# Patient Record
Sex: Female | Born: 1960 | Race: White | Hispanic: No | Marital: Married | Smoking: Never smoker
Health system: Southern US, Community
[De-identification: ages and names within clinical notes are randomized; demographics above are authoritative.]

## PROBLEM LIST (undated history)

## (undated) DIAGNOSIS — I499 Cardiac arrhythmia, unspecified: Secondary | ICD-10-CM

## (undated) DIAGNOSIS — I10 Essential (primary) hypertension: Secondary | ICD-10-CM

## (undated) DIAGNOSIS — E079 Disorder of thyroid, unspecified: Secondary | ICD-10-CM

## (undated) DIAGNOSIS — Z8619 Personal history of other infectious and parasitic diseases: Secondary | ICD-10-CM

## (undated) DIAGNOSIS — E039 Hypothyroidism, unspecified: Secondary | ICD-10-CM

## (undated) DIAGNOSIS — M797 Fibromyalgia: Secondary | ICD-10-CM

## (undated) DIAGNOSIS — R7301 Impaired fasting glucose: Secondary | ICD-10-CM

## (undated) DIAGNOSIS — M1990 Unspecified osteoarthritis, unspecified site: Secondary | ICD-10-CM

## (undated) DIAGNOSIS — R7303 Prediabetes: Secondary | ICD-10-CM

## (undated) HISTORY — PX: EYE SURGERY: SHX253

## (undated) HISTORY — DX: Cardiac arrhythmia, unspecified: I49.9

## (undated) HISTORY — DX: Fibromyalgia: M79.7

## (undated) HISTORY — PX: PARTIAL HYSTERECTOMY: SHX80

## (undated) HISTORY — DX: Essential (primary) hypertension: I10

## (undated) HISTORY — DX: Hypothyroidism, unspecified: E03.9

## (undated) HISTORY — PX: CATARACT EXTRACTION, BILATERAL: SHX1313

## (undated) HISTORY — DX: Personal history of other infectious and parasitic diseases: Z86.19

## (undated) HISTORY — DX: Disorder of thyroid, unspecified: E07.9

## (undated) HISTORY — PX: ABDOMINAL HYSTERECTOMY: SHX81

## (undated) HISTORY — DX: Impaired fasting glucose: R73.01

## (undated) HISTORY — DX: Unspecified osteoarthritis, unspecified site: M19.90

---

## 1993-01-17 HISTORY — PX: PARTIAL HYSTERECTOMY: SHX80

## 2009-02-01 ENCOUNTER — Inpatient Hospital Stay (HOSPITAL_COMMUNITY): Admission: EM | Admit: 2009-02-01 | Discharge: 2009-02-05 | Payer: Self-pay | Admitting: Emergency Medicine

## 2009-02-01 ENCOUNTER — Ambulatory Visit: Payer: Self-pay | Admitting: Cardiology

## 2009-07-01 ENCOUNTER — Encounter: Payer: Self-pay | Admitting: Cardiology

## 2010-04-05 LAB — POCT CARDIAC MARKERS
CKMB, poc: <1 ng/mL — ABNORMAL LOW (ref 1.0–8.0)
Myoglobin, poc: 53.2 ng/mL (ref 12–200)
Troponin i, poc: <0.05 ng/mL (ref 0.00–0.09)
Troponin i, poc: <0.05 ng/mL (ref 0.00–0.09)

## 2010-04-05 LAB — BASIC METABOLIC PANEL
BUN: 8 mg/dL (ref 6–23)
CO2: 22 mEq/L (ref 19–32)
CO2: 22 mEq/L (ref 19–32)
Calcium: 8.6 mg/dL (ref 8.4–10.5)
Chloride: 106 mEq/L (ref 96–112)
Chloride: 109 mEq/L (ref 96–112)
Creatinine, Ser: 0.59 mg/dL (ref 0.4–1.2)
Creatinine, Ser: 0.69 mg/dL (ref 0.4–1.2)
Creatinine, Ser: 0.75 mg/dL (ref 0.4–1.2)
GFR calc Af Amer: >60 mL/min (ref >60)
GFR calc Af Amer: >60 mL/min (ref >60)
GFR calc non Af Amer: >60 mL/min (ref >60)
Sodium: 136 mEq/L (ref 135–145)
Sodium: 138 mEq/L (ref 135–145)
Sodium: 140 mEq/L (ref 135–145)

## 2010-04-05 LAB — CBC
Hemoglobin: 13.3 g/dL (ref 12.0–15.0)
MCHC: 33.7 g/dL (ref 30.0–36.0)
Platelets: 292 10*3/uL (ref 150–400)
RDW: 14.6 % (ref 11.5–15.5)

## 2010-04-05 LAB — URINALYSIS, MICROSCOPIC ONLY
Glucose, UA: NEGATIVE mg/dL
Ketones, ur: NEGATIVE mg/dL
Leukocytes, UA: NEGATIVE
Nitrite: NEGATIVE
Protein, ur: NEGATIVE mg/dL
Urobilinogen, UA: 1 mg/dL (ref 0.0–1.0)

## 2010-04-05 LAB — COMPREHENSIVE METABOLIC PANEL
Alkaline Phosphatase: 85 U/L (ref 39–117)
BUN: 14 mg/dL (ref 6–23)
CO2: 21 mEq/L (ref 19–32)
Calcium: 8.9 mg/dL (ref 8.4–10.5)
Creatinine, Ser: 0.91 mg/dL (ref 0.4–1.2)
GFR calc Af Amer: >60 mL/min (ref >60)
Glucose, Bld: 173 mg/dL — ABNORMAL HIGH (ref 70–99)

## 2010-04-05 LAB — CATECHOLAMINES, FRACTIONATED, URINE, 24 HOUR
Creatinine, Urine mg/day-CATEUR: 1.65 g/(24.h) (ref 0.63–2.50)
Dopamine 24 Hr Urine: 234 mcg/24 h (ref 52–480)
Norepinephrine 24 Hr Urine: 59 mcg/24 h (ref 15–100)
Total urine volume: 2750 mL

## 2010-04-05 LAB — CK TOTAL AND CKMB (NOT AT ARMC)
Relative Index: INVALID (ref 0.0–2.5)
Total CK: 39 U/L (ref 7–177)

## 2010-04-05 LAB — BRAIN NATRIURETIC PEPTIDE: Pro B Natriuretic peptide (BNP): <30 pg/mL (ref 0.0–100.0)

## 2010-04-05 LAB — DIFFERENTIAL
Basophils Absolute: 0.1 10*3/uL (ref 0.0–0.1)
Basophils Relative: 1 % (ref 0–1)
Eosinophils Absolute: 0.1 10*3/uL (ref 0.0–0.7)
Eosinophils Relative: 1 % (ref 0–5)
Neutrophils Relative %: 50 % (ref 43–77)

## 2010-04-05 LAB — ALDOSTERONE, URINE, 24 HOUR

## 2010-04-05 LAB — TSH: TSH: 1.633 u[IU]/mL (ref 0.350–4.500)

## 2010-04-05 LAB — METANEPHRINES, URINE, 24 HOUR
Metanephrines, Ur: 92 mcg/24 h (ref 58–203)
Volume, Urine-METAN: 2750 mL

## 2010-04-05 LAB — URINE CULTURE
Colony Count: 9000
Special Requests: NEGATIVE

## 2010-04-05 LAB — T4: T4, Total: 7.5 ug/dL (ref 5.0–12.5)

## 2010-04-05 LAB — VMA + CREATININE, URINE (TIMED COLLECTION): Vanillylmandelic Acid, (VMA): 3 mg/24 h (ref <=6.0)

## 2010-04-05 LAB — CORTISOL, URINE, FREE: Volume, Urine-CORTUR: 2750 mL

## 2011-01-03 ENCOUNTER — Other Ambulatory Visit: Payer: Self-pay | Admitting: Cardiology

## 2011-01-16 ENCOUNTER — Other Ambulatory Visit: Payer: Self-pay | Admitting: Cardiology

## 2011-03-29 ENCOUNTER — Other Ambulatory Visit: Payer: Self-pay | Admitting: Cardiology

## 2011-03-31 NOTE — Telephone Encounter (Signed)
Patient not seen in over a year.

## 2011-04-22 ENCOUNTER — Encounter: Payer: Self-pay | Admitting: *Deleted

## 2011-05-23 IMAGING — CR DG CHEST 1V PORT
1 series · 1 of 1 positions shown · non-contrast
Comparison: None

CLINICAL DATA: Chest pain and shortness of breath.

PORTABLE CHEST - 1 VIEW

[view not recorded]
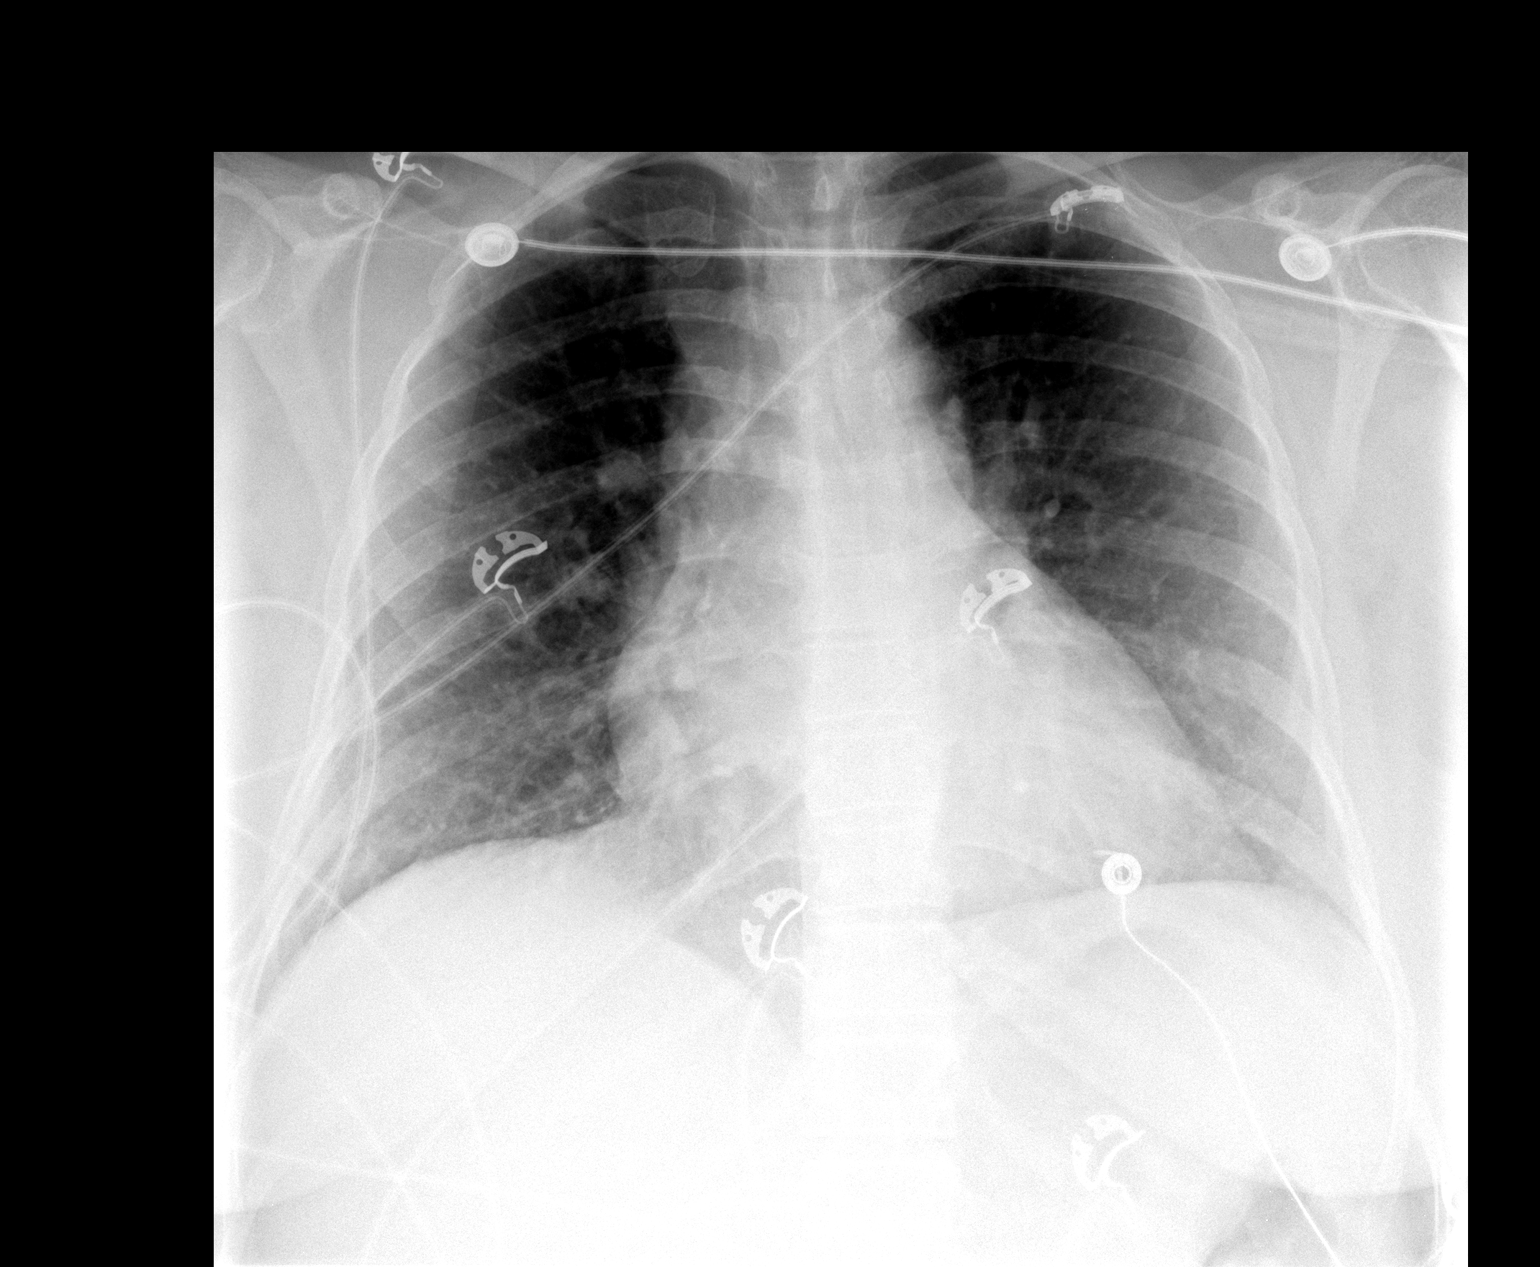

[1 of 1 positions shown; findings below may reference images not displayed]

FINDINGS: The heart is borderline enlarged.  There is mild vascular
congestion but no infiltrates, edema or effusions.  The bony thorax
is intact.
IMPRESSION: 1.  Borderline heart size.
2.  Mild vascular congestion but no infiltrates, edema or
effusions.

## 2011-05-26 ENCOUNTER — Ambulatory Visit: Payer: Self-pay | Admitting: *Deleted

## 2011-10-19 ENCOUNTER — Encounter: Payer: Self-pay | Admitting: Cardiology

## 2013-02-19 ENCOUNTER — Other Ambulatory Visit (HOSPITAL_COMMUNITY): Payer: Self-pay | Admitting: Obstetrics and Gynecology

## 2013-02-19 DIAGNOSIS — M7989 Other specified soft tissue disorders: Secondary | ICD-10-CM

## 2013-02-19 DIAGNOSIS — M25569 Pain in unspecified knee: Secondary | ICD-10-CM

## 2013-02-20 ENCOUNTER — Ambulatory Visit (HOSPITAL_COMMUNITY)
Admission: RE | Admit: 2013-02-20 | Discharge: 2013-02-20 | Disposition: A | Discharge status: Home / Self Care | Payer: BC Managed Care – PPO | Source: Ambulatory Visit | Attending: Obstetrics and Gynecology | Admitting: Obstetrics and Gynecology

## 2013-02-20 DIAGNOSIS — M79609 Pain in unspecified limb: Secondary | ICD-10-CM

## 2013-02-20 DIAGNOSIS — M7989 Other specified soft tissue disorders: Secondary | ICD-10-CM | POA: Insufficient documentation

## 2013-02-20 DIAGNOSIS — M25569 Pain in unspecified knee: Secondary | ICD-10-CM

## 2013-02-20 NOTE — Progress Notes (Signed)
VASCULAR LAB PRELIMINARY  PRELIMINARY  PRELIMINARY  PRELIMINARY  Right lower extremity venous duplex completed.    Preliminary report:  Right:  No evidence of DVT, superficial thrombosis, or Baker's cyst.  Dreshon Proffit, RVS 02/20/2013, 3:30 PM

## 2015-03-23 DIAGNOSIS — F32A Depression, unspecified: Secondary | ICD-10-CM | POA: Insufficient documentation

## 2015-03-23 DIAGNOSIS — F329 Major depressive disorder, single episode, unspecified: Secondary | ICD-10-CM | POA: Insufficient documentation

## 2015-05-21 DIAGNOSIS — D649 Anemia, unspecified: Secondary | ICD-10-CM | POA: Insufficient documentation

## 2015-05-21 DIAGNOSIS — G4700 Insomnia, unspecified: Secondary | ICD-10-CM | POA: Insufficient documentation

## 2015-05-25 DIAGNOSIS — R7303 Prediabetes: Secondary | ICD-10-CM | POA: Insufficient documentation

## 2015-12-09 DIAGNOSIS — E6609 Other obesity due to excess calories: Secondary | ICD-10-CM | POA: Insufficient documentation

## 2020-02-24 ENCOUNTER — Other Ambulatory Visit: Payer: Self-pay

## 2020-02-24 ENCOUNTER — Encounter: Payer: Self-pay | Admitting: Family Medicine

## 2020-02-24 ENCOUNTER — Ambulatory Visit (INDEPENDENT_AMBULATORY_CARE_PROVIDER_SITE_OTHER): Payer: BC Managed Care – PPO | Admitting: Family Medicine

## 2020-02-24 VITALS — BP 126/68 | HR 76 | Temp 97.8°F | Ht 66.0 in | Wt 200.6 lb

## 2020-02-24 DIAGNOSIS — E039 Hypothyroidism, unspecified: Secondary | ICD-10-CM | POA: Insufficient documentation

## 2020-02-24 DIAGNOSIS — G2581 Restless legs syndrome: Secondary | ICD-10-CM | POA: Diagnosis not present

## 2020-02-24 DIAGNOSIS — M797 Fibromyalgia: Secondary | ICD-10-CM | POA: Diagnosis not present

## 2020-02-24 DIAGNOSIS — E1169 Type 2 diabetes mellitus with other specified complication: Secondary | ICD-10-CM | POA: Insufficient documentation

## 2020-02-24 DIAGNOSIS — M79651 Pain in right thigh: Secondary | ICD-10-CM | POA: Diagnosis not present

## 2020-02-24 DIAGNOSIS — R7303 Prediabetes: Secondary | ICD-10-CM | POA: Diagnosis not present

## 2020-02-24 DIAGNOSIS — M1990 Unspecified osteoarthritis, unspecified site: Secondary | ICD-10-CM

## 2020-02-24 DIAGNOSIS — I10 Essential (primary) hypertension: Secondary | ICD-10-CM

## 2020-02-24 DIAGNOSIS — E118 Type 2 diabetes mellitus with unspecified complications: Secondary | ICD-10-CM | POA: Insufficient documentation

## 2020-02-24 LAB — CBC WITH DIFFERENTIAL/PLATELET
Basophils Absolute: 0 10*3/uL (ref 0.0–0.1)
Basophils Relative: 0.6 % (ref 0.0–3.0)
Eosinophils Absolute: 0.1 10*3/uL (ref 0.0–0.7)
Eosinophils Relative: 1.6 % (ref 0.0–5.0)
HCT: 37.8 % (ref 36.0–46.0)
Hemoglobin: 12.3 g/dL (ref 12.0–15.0)
Lymphocytes Relative: 38.3 % (ref 12.0–46.0)
Lymphs Abs: 2.3 10*3/uL (ref 0.7–4.0)
MCHC: 32.5 g/dL (ref 30.0–36.0)
MCV: 84.8 fl (ref 78.0–100.0)
Monocytes Absolute: 0.5 10*3/uL (ref 0.1–1.0)
Monocytes Relative: 8.3 % (ref 3.0–12.0)
Neutro Abs: 3.1 10*3/uL (ref 1.4–7.7)
Neutrophils Relative %: 51.2 % (ref 43.0–77.0)
Platelets: 257 10*3/uL (ref 150.0–400.0)
RBC: 4.46 Mil/uL (ref 3.87–5.11)
RDW: 14.1 % (ref 11.5–15.5)
WBC: 6.1 10*3/uL (ref 4.0–10.5)

## 2020-02-24 LAB — COMPREHENSIVE METABOLIC PANEL
ALT: 26 U/L (ref 0–35)
AST: 22 U/L (ref 0–37)
Albumin: 3.8 g/dL (ref 3.5–5.2)
Alkaline Phosphatase: 85 U/L (ref 39–117)
BUN: 13 mg/dL (ref 6–23)
CO2: 27 mEq/L (ref 19–32)
Calcium: 9.5 mg/dL (ref 8.4–10.5)
Chloride: 105 mEq/L (ref 96–112)
Creatinine, Ser: 0.53 mg/dL (ref 0.40–1.20)
GFR: 100.59 mL/min (ref >60.00)
Glucose, Bld: 122 mg/dL — ABNORMAL HIGH (ref 70–99)
Potassium: 4.5 mEq/L (ref 3.5–5.1)
Sodium: 139 mEq/L (ref 135–145)
Total Bilirubin: 0.3 mg/dL (ref 0.2–1.2)
Total Protein: 6.9 g/dL (ref 6.0–8.3)

## 2020-02-24 LAB — IBC PANEL
Iron: 67 ug/dL (ref 42–145)
Saturation Ratios: 14.5 % — ABNORMAL LOW (ref 20.0–50.0)
Transferrin: 330 mg/dL (ref 212.0–360.0)

## 2020-02-24 LAB — LIPID PANEL
Cholesterol: 141 mg/dL (ref 0–200)
HDL: 38.8 mg/dL — ABNORMAL LOW (ref >39.00)
LDL Cholesterol: 85 mg/dL (ref 0–99)
NonHDL: 102.48
Total CHOL/HDL Ratio: 4
Triglycerides: 89 mg/dL (ref 0.0–149.0)
VLDL: 17.8 mg/dL (ref 0.0–40.0)

## 2020-02-24 LAB — FERRITIN: Ferritin: 26 ng/mL (ref 10.0–291.0)

## 2020-02-24 LAB — HEMOGLOBIN A1C: Hgb A1c MFr Bld: 6.4 % (ref 4.6–6.5)

## 2020-02-24 LAB — TSH: TSH: <0.01 u[IU]/mL — ABNORMAL LOW (ref 0.35–4.50)

## 2020-02-24 MED ORDER — METOPROLOL TARTRATE 50 MG PO TABS: 50.0000 mg | ORAL_TABLET | Freq: Two times a day (BID) | ORAL | 1 refills | Status: DC

## 2020-02-24 MED ORDER — CITALOPRAM HYDROBROMIDE 20 MG PO TABS: 20.0000 mg | ORAL_TABLET | Freq: Every day | ORAL | 1 refills | Status: DC

## 2020-02-24 MED ORDER — CELECOXIB 200 MG PO CAPS: 200.0000 mg | ORAL_CAPSULE | Freq: Every day | ORAL | 1 refills | Status: DC

## 2020-02-24 MED ORDER — LEVOTHYROXINE SODIUM 175 MCG PO TABS
175.0000 ug | ORAL_TABLET | Freq: Every day | ORAL | 1 refills | Status: DC
Start: 2020-02-24 — End: 2020-02-25

## 2020-02-24 MED ORDER — METFORMIN HCL 1000 MG PO TABS: 1000.0000 mg | ORAL_TABLET | Freq: Every day | ORAL | 1 refills | Status: DC

## 2020-02-24 NOTE — Assessment & Plan Note (Signed)
With impaired fasting glucose, on metformin 1000mg  daily. Update labs today.

## 2020-02-24 NOTE — Assessment & Plan Note (Signed)
Chronic, stable. Continue metoprolol 50mg  bid.

## 2020-02-24 NOTE — Assessment & Plan Note (Addendum)
On celebrex 200mg  daily for this.  Symptoms not consistent with gout.

## 2020-02-24 NOTE — Assessment & Plan Note (Signed)
Describes RLS - check iron and TSH.  Consider treatment pending results.

## 2020-02-24 NOTE — Assessment & Plan Note (Signed)
Anticipate related to CVI. Discussed with patient. Pulses ok, no neuropathy symptoms. Suggested trial thigh high compression stockings, Rx provided.  She does have enlarged R leg compared to left - will check venous US.  Consider venous surgery eval if ongoing/worsening.

## 2020-02-24 NOTE — Progress Notes (Signed)
Patient ID: Carrie Pham, female    DOB: 27-May-1960, 60 y.o.   MRN: [id number]  This visit was conducted in person.  BP 126/68 (BP Location: Left Arm, Patient Position: Sitting, Cuff Size: Large)   Pulse 76   Temp 97.8 F (36.6 C) (Temporal)   Ht 5\' 6"  (1.676 m)   Wt 200 lb 9 oz (91 kg)   SpO2 96%   BMI 32.37 kg/m    CC: new pt to establish care  Subjective:   HPI: Carrie Pham is a 60 y.o. female presenting on 02/24/2020 for New Patient (Initial Visit)   Previously saw Dr 04/23/2020 and Andrey Campanile at [facility] in [place], last seen 2017. Known h/o prediabetes, HTN, hypothyroidism, depression, arthritis and fibromyalgia.   R leg swelling and ache present for 6 months. Starts upper inner going and travels down medially below the knee. No known circulation issues. More bothersome when swollen. No aggravating factors. No warmth. R>L spider veins. Remotely tried compression stockings.   HTN - Compliant with current antihypertensive regimen of metoprolol 50mg  bid.  Does check blood pressures at home: 130s/70s. No low blood pressure readings or symptoms of dizziness/syncope. Denies HA, vision changes, CP/tightness, SOB, leg swelling.   Impaired fasting sugar - unsure when last checked A1c. Continues metformin 1000mg  daily. Does not check sugars at home. No low sugar symptoms. No high sugar symptoms of paresthesias. Last eye exam - just had cataract surgery.   Notes restless leg symptoms - worse at night.   Hypothyroid - on levothyroxine 2018 daily.   Fibromyalgia - diagnosed around 2010. On celexa 20mg  and celebrex 200mg  daily.   OBGYN - Dr  Some hot flashes post menopause.   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  Interim medical history since our last visit reviewed. Allergies and medications reviewed and updated. Outpatient Medications Prior to Visit  Medication Sig Dispense Refill  . fluconazole (DIFLUCAN) 100 MG tablet Take 100 mg by mouth daily. As needed    . celecoxib (CELEBREX) 200 MG capsule Take 200 mg by mouth 2 (two) times daily.    . citalopram (CELEXA) 20 MG tablet Take 20 mg by mouth daily.    levothyroxine (SYNTHROID) 175 MCG tablet Take 175 mcg by mouth daily before breakfast.    . metFORMIN (GLUCOPHAGE) 1000 MG tablet Take 1,000 mg by mouth 2 (two) times daily with a meal.    . metoprolol (LOPRESSOR) 50 MG tablet Take 50 mg by mouth 2 (two) times daily.    . hydrochlorothiazide (MICROZIDE) 12.5 MG capsule TAKE 1 CAPSULE DAILY 15 capsule 0  . levothyroxine (SYNTHROID, LEVOTHROID) 112 MCG tablet Take 112 mcg by mouth daily.    . metFORMIN (GLUCOPHAGE) 1000 MG tablet Take 1 tablet (1,000 mg total) by mouth daily with breakfast.    . Milnacipran (SAVELLA) 50 MG TABS Take 50 mg by mouth 2 (two) times daily.    zolpidem (AMBIEN) 10 MG tablet Take 10 mg by mouth at bedtime as needed.     No facility-administered medications prior to visit.     Per HPI unless specifically indicated in ROS section below Review of Systems Objective:  BP 126/68 (BP Location: Left Arm, Patient Position: Sitting, Cuff Size:  Large)   Pulse 76   Temp 97.8 F (36.6 C) (Temporal)   Ht 5\' 6"  (1.676 m)   Wt 200 lb 9 oz (91 kg)   SpO2 96%   BMI 32.37 kg/m   Wt Readings from Last 3 Encounters:  02/24/20 200 lb 9 oz (91 kg)      Physical Exam Vitals and nursing note reviewed.  Constitutional:      Appearance: Normal appearance. She is not ill-appearing.  Neck:     Thyroid: No thyroid mass or thyromegaly.  Cardiovascular:     Rate and Rhythm: Normal rate and regular rhythm.     Pulses: Normal pulses.     Heart sounds: Normal heart sounds. No murmur heard.   Pulmonary:     Effort: Pulmonary effort  is normal. No respiratory distress.     Breath sounds: Normal breath sounds. No wheezing, rhonchi or rales.  Musculoskeletal:     Right lower leg: No edema.     Left lower leg: No edema.     Comments:  37cm L calf circ 38cm R calf circ Crepitus with knee ROM bilaterally No pain with int/ext rotation at hip Mild discomfort to palpation medial thigh on right  Skin:    General: Skin is warm and dry.     Findings: No rash.     Comments: Spider veins medial R leg   Neurological:     Mental Status: She is alert.  Psychiatric:        Mood and Affect: Mood normal.        Behavior: Behavior normal.        Assessment & Plan:  This visit occurred during the SARS-CoV-2 public health emergency.  Safety protocols were in place, including screening questions prior to the visit, additional usage of staff PPE, and extensive cleaning of exam room while observing appropriate contact time as indicated for disinfecting solutions.   Problem List Items Addressed This Visit    Right thigh pain - Primary    Anticipate related to CVI. Discussed with patient. Pulses ok, no neuropathy symptoms. Suggested trial thigh high compression stockings, Rx provided.  She does have enlarged R leg compared to left - will check venous 04/23/20.  Consider venous surgery eval if ongoing/worsening.       Relevant Orders   US Venous Img Lower Unilateral Right (DVT)   Restless leg syndrome    Describes RLS - check iron and TSH.  Consider treatment pending results.       Relevant Orders   TSH   CBC with Differential/Platelet   Ferritin   IBC panel   Prediabetes    With impaired fasting glucose, on metformin 1000mg  daily. Update labs today.       Relevant Orders   Lipid panel   Comprehensive metabolic panel   TSH   Hemoglobin A1c   Hypothyroidism    Chronic, stable on levothyroxine - update TSH.       Relevant Medications   levothyroxine (SYNTHROID) 175 MCG tablet   metoprolol tartrate (LOPRESSOR) 50 MG  tablet   Hypertension    Chronic, stable. Continue metoprolol 50mg  bid.       Relevant Medications   metoprolol tartrate (LOPRESSOR) 50 MG tablet   Fibromyalgia    On celebrex and celexa - continue.  Previously on Savella.       Relevant Medications   celecoxib (CELEBREX) 200 MG capsule   citalopram (CELEXA) 20 MG tablet   Arthritis    On celebrex  200mg  daily for this.  Symptoms not consistent with gout.      Relevant Medications   celecoxib (CELEBREX) 200 MG capsule       Meds ordered this encounter  Medications  . celecoxib (CELEBREX) 200 MG capsule    Sig: Take 1 capsule (200 mg total) by mouth daily.    Dispense:  90 capsule    Refill:  1  . citalopram (CELEXA) 20 MG tablet    Sig: Take 1 tablet (20 mg total) by mouth daily.    Dispense:  90 tablet    Refill:  1  . levothyroxine (SYNTHROID) 175 MCG tablet    Sig: Take 1 tablet (175 mcg total) by mouth daily before breakfast.    Dispense:  90 tablet    Refill:  1  . metFORMIN (GLUCOPHAGE) 1000 MG tablet    Sig: Take 1 tablet (1,000 mg total) by mouth daily with breakfast.    Dispense:  90 tablet    Refill:  1  . metoprolol tartrate (LOPRESSOR) 50 MG tablet    Sig: Take 1 tablet (50 mg total) by mouth 2 (two) times daily.    Dispense:  180 tablet    Refill:  1   Orders Placed This Encounter  Procedures  . Venous Img Lower Unilateral Right (DVT)    Standing Status:   Future    Standing Expiration Date:   02/23/2021    Order Specific Question:   Reason for Exam (SYMPTOM  OR DIAGNOSIS REQUIRED)    Answer:   R medial thigh swelling and pain for months    Order Specific Question:   Preferred imaging location?    Answer:   04/23/2021  . Lipid panel  . Comprehensive metabolic panel  . TSH  . CBC with Differential/Platelet  . Ferritin  . IBC panel  . Hemoglobin A1c    Patient Instructions  Nice to meet you today Labs today. We will be in touch with results. Consider thigh high compression  stockings. First check leg ultrasound on the right. If ongoing trouble, we could refer you to vein doctor for further evaluation of right leg pain.  Return in 6 months for physical, sooner if needed.    Follow up plan: Return in about 6 months (around 08/23/2020), or if symptoms worsen or fail to improve, for annual exam, prior fasting for blood work.  10/23/2020, MD

## 2020-02-24 NOTE — Assessment & Plan Note (Signed)
Chronic, stable on levothyroxine - update TSH.

## 2020-02-24 NOTE — Assessment & Plan Note (Signed)
On celebrex and celexa - continue.  Previously on Savella.

## 2020-02-24 NOTE — Patient Instructions (Addendum)
Nice to meet you today Labs today. We will be in touch with results. Consider thigh high compression stockings. First check leg ultrasound on the right. If ongoing trouble, we could refer you to vein doctor for further evaluation of right leg pain.  Return in 6 months for physical, sooner if needed.

## 2020-02-25 ENCOUNTER — Encounter: Payer: Self-pay | Admitting: Family Medicine

## 2020-02-25 ENCOUNTER — Other Ambulatory Visit: Payer: Self-pay | Admitting: Family Medicine

## 2020-02-25 MED ORDER — LEVOTHYROXINE SODIUM 150 MCG PO TABS: 150.0000 ug | ORAL_TABLET | Freq: Every day | ORAL | 1 refills | Status: DC

## 2020-03-09 ENCOUNTER — Other Ambulatory Visit: Payer: Self-pay

## 2020-03-09 ENCOUNTER — Ambulatory Visit
Admission: RE | Admit: 2020-03-09 | Discharge: 2020-03-09 | Disposition: A | Discharge status: Home / Self Care | Payer: BC Managed Care – PPO | Source: Ambulatory Visit | Attending: Family Medicine | Admitting: Family Medicine

## 2020-03-09 DIAGNOSIS — M79651 Pain in right thigh: Secondary | ICD-10-CM | POA: Insufficient documentation

## 2020-03-16 ENCOUNTER — Telehealth: Payer: Self-pay | Admitting: Family Medicine

## 2020-03-16 NOTE — Telephone Encounter (Signed)
Spoke with pt to relay ultrasound results.  States she got my message and verbalizes understanding.  Says she will try to go by medical supply to get thigh high compression stockings to see if that helps.

## 2020-03-16 NOTE — Telephone Encounter (Signed)
Patient called stating that she was wanting her results from her ultrasound. Can you please call the patient to give her the results? EM

## 2020-05-02 ENCOUNTER — Other Ambulatory Visit: Payer: Self-pay | Admitting: Family Medicine

## 2020-05-02 DIAGNOSIS — E039 Hypothyroidism, unspecified: Secondary | ICD-10-CM

## 2020-05-02 DIAGNOSIS — E118 Type 2 diabetes mellitus with unspecified complications: Secondary | ICD-10-CM

## 2020-05-06 ENCOUNTER — Other Ambulatory Visit: Payer: BC Managed Care – PPO

## 2020-08-14 ENCOUNTER — Other Ambulatory Visit: Payer: Self-pay | Admitting: Family Medicine

## 2020-08-14 NOTE — Telephone Encounter (Signed)
Per 02/24/20 Lab Result Note, pt was to return for thyroid labs.  Then have CPE in 08/2020.  Plz schedule cpe lab and cpe visits.  Then return phn note to me for refill.

## 2020-08-14 NOTE — Telephone Encounter (Signed)
Spoke with patient scheduled CPE . Patient request to have same day labs

## 2020-08-14 NOTE — Telephone Encounter (Signed)
Noted.   Called pt scheduling cpe lab visit on 10/05/20 at 8:05.   E-scribed refill.

## 2020-08-26 ENCOUNTER — Telehealth: Payer: Self-pay

## 2020-08-26 ENCOUNTER — Ambulatory Visit
Admission: RE | Admit: 2020-08-26 | Discharge: 2020-08-26 | Disposition: A | Discharge status: Home / Self Care | Payer: PRIVATE HEALTH INSURANCE | Source: Ambulatory Visit | Attending: Family Medicine | Admitting: Family Medicine

## 2020-08-26 ENCOUNTER — Other Ambulatory Visit: Payer: Self-pay

## 2020-08-26 DIAGNOSIS — U071 COVID-19: Secondary | ICD-10-CM

## 2020-08-26 MED ORDER — NIRMATRELVIR/RITONAVIR (PAXLOVID)TABLET: 3.0000 | ORAL_TABLET | Freq: Two times a day (BID) | ORAL | 0 refills | Status: AC

## 2020-08-26 NOTE — Telephone Encounter (Signed)
Pt left v/m that she tested + for covid with home test 08/26/20. I spoke with pt; Pts symptoms started on 08/24/20; pt has head and chest congestion. Pt is feeling tired.no fever, body aches and no chills,pt has H/A 5. Pt has dry cough and taking mucinex. When pt blows nose mucus is greenish yellow. No SOB or difficulty breathing. Starting 2 days ago pt has had watery diarrhea and in last 24 hours has had watery diarrhea x 4. No abd pain or dizziness. Pt has dry mouth. Pt is trying to drink; no vomiting. Sinus and head congestion and scratchy S/T. No available appts at [facility] today and I scheduled appt at [facility] in [place] for pt 08/26/20 at 5PM. UC & ED precautions given and pt voiced understanding. Pt will self quarantine, drink plenty of fluids, rest and take tylenol is spikes fever. Sending note to DR G.

## 2020-08-26 NOTE — ED Provider Notes (Signed)
[facility]   [id number] 08/26/20 Arrival Time: 1634  ASSESSMENT & PLAN:  1. COVID-19 virus infection    Discussed typical duration of viral illnesses. Would like Rx for Paxlovid OTC symptom care as needed.  Meds ordered this encounter  Medications   Carrie EUA (PAXLOVID) Pham    Sig: Take 3 tablets by mouth 2 (two) times daily for 5 days. Patient GFR is 100. Take nirmatrelvir (150 mg) two tablets twice daily for 5 days and ritonavir (100 mg) one tablet twice daily for 5 days.    Dispense:  30 tablet    Refill:  0   Reviewed expectations re: course of current medical issues. Questions answered. Outlined signs and symptoms indicating need for more acute intervention. Understanding verbalized. After Visit Summary given.   SUBJECTIVE: History from: patient. Carrie Pham is a 60 y.o. female who presents with worries regarding COVID-19. Known COVID-19 contact: none. Recent travel: none. Reports: positive COVID test at home. Cough, congestion, mild diarrhea x 2 days. Denies: fever and difficulty breathing. Normal PO intake without n/v/d.   OBJECTIVE:  General appearance: alert; no distress Eyes: PERRLA; EOMI; conjunctiva normal HENT: [place]; AT; with nasal congestion Neck: supple  Lungs: speaks full sentences without difficulty; unlabored Extremities: no edema Skin: warm and dry Neurologic: normal gait Psychological: alert and cooperative; normal mood and affect  Labs: Results for orders placed or performed in visit on 02/24/20  Lipid panel  Result Value Ref Range   Cholesterol 141 0 - 200 mg/dL   Triglycerides 76.1 0.0 - 149.0 mg/dL   HDL 60.73 (L) >71.06 mg/dL   VLDL 26.9 0.0 - 48.5 mg/dL   LDL Cholesterol 85 0 - 99 mg/dL   Total CHOL/HDL Ratio 4    NonHDL 102.48   Comprehensive metabolic panel  Result Value Ref Range   Sodium 139 135 - 145 mEq/L   Potassium 4.5 3.5 - 5.1 mEq/L   Chloride 105 96 - 112 mEq/L   CO2 27 19 - 32 mEq/L    Glucose, Bld 122 (H) 70 - 99 mg/dL   BUN 13 6 - 23 mg/dL   Creatinine, Ser 4.62 0.40 - 1.20 mg/dL   Total Bilirubin 0.3 0.2 - 1.2 mg/dL   Alkaline Phosphatase 85 39 - 117 U/L   AST 22 0 - 37 U/L   ALT 26 0 - 35 U/L   Total Protein 6.9 6.0 - 8.3 g/dL   Albumin 3.8 3.5 - 5.2 g/dL   GFR 703.50 >09.38 mL/min   Calcium 9.5 8.4 - 10.5 mg/dL  TSH  Result Value Ref Range   TSH <0.01 Repeated and verified X2. (L) 0.35 - 4.50 uIU/mL  CBC with Differential/Platelet  Result Value Ref Range   WBC 6.1 4.0 - 10.5 K/uL   RBC 4.46 3.87 - 5.11 Mil/uL   Hemoglobin 12.3 12.0 - 15.0 g/dL   HCT 18.2 99.3 - 71.6 %   MCV 84.8 78.0 - 100.0 fl   MCHC 32.5 30.0 - 36.0 g/dL   RDW 96.7 89.3 - 81.0 %   Platelets 257.0 150.0 - 400.0 K/uL   Neutrophils Relative % 51.2 43.0 - 77.0 %   Lymphocytes Relative 38.3 12.0 - 46.0 %   Monocytes Relative 8.3 3.0 - 12.0 %   Eosinophils Relative 1.6 0.0 - 5.0 %   Basophils Relative 0.6 0.0 - 3.0 %   Neutro Abs 3.1 1.4 - 7.7 K/uL   Lymphs Abs 2.3 0.7 - 4.0 K/uL   Monocytes Absolute 0.5  0.1 - 1.0 K/uL   Eosinophils Absolute 0.1 0.0 - 0.7 K/uL   Basophils Absolute 0.0 0.0 - 0.1 K/uL  Ferritin  Result Value Ref Range   Ferritin 26.0 10.0 - 291.0 ng/mL  IBC panel  Result Value Ref Range   Iron 67 42 - 145 ug/dL   Transferrin 259.5 638.7 - 360.0 mg/dL   Saturation Ratios 56.4 (L) 20.0 - 50.0 %  Hemoglobin A1c  Result Value Ref Range   Hgb A1c MFr Bld 6.4 4.6 - 6.5 %    No Known Allergies  Past Medical History:  Diagnosis Date   Arthritis    Fibromyalgia    History of chicken pox    Hypertension    Hypothyroidism    Impaired fasting blood sugar    Social History   Socioeconomic History   Marital status: Married    Spouse name: Not on file   Number of children: Not on file   Years of education: Not on file   Highest education level: Not on file  Occupational History   Not on file  Tobacco Use   Smoking status: Never   Smokeless tobacco: Never   Substance and Sexual Activity   Alcohol use: Yes    Comment: occasional   Drug use: Never   Sexual activity: Yes    Partners: Male  Other Topics Concern   Not on file  Social History Narrative   Lives with husband   Occ: mortgage loan processor    Activity: joined gym going 3-4 /wk   Diet: poor water, good fruits/vegetables, sweet tea and soda   Social Determinants of Health   Financial Resource Strain: Not on file  Food Insecurity: Not on file  Transportation Needs: Not on file  Physical Activity: Not on file  Stress: Not on file  Social Connections: Not on file  Intimate Partner Violence: Not on file   Family History  Problem Relation Age of Onset   Hypertension Mother    Arthritis Mother    Cancer Mother        skin   High Cholesterol Mother    Heart failure Father    Emphysema Father    Arthritis Father    Asthma Father    COPD Father        smoker   Diabetes Sister    Breast cancer Sister    Diabetes Brother    Diabetes Sister    Breast cancer Sister    Stroke Neg Hx    CAD Neg Hx    Past Surgical History:  Procedure Laterality Date   CATARACT EXTRACTION, BILATERAL Bilateral 10/2019, 12/2019   CESAREAN SECTION     x2   PARTIAL HYSTERECTOMY  1995   benign, heavy bleeding, ovaries remain (Carrie Pham)    Interaction Report Date Produced: Report ID: [id number] August 2020 Treatment Co-medications Carrie (5 days) Wynelle Link read the interaction details as management of these interactions may be complex.] Celecoxib Citalopram Metformin Metoprolol This report lists the summaries of potential interactions (i.e. "red", "amber" and "yellow" classifications) for the drugs in the table above. Drug-drug interactions between Covid drugs are NOT assessed in this report. Interactions with a "green" classification (i.e. no clinically significant interaction) have been checked and are listed at the end of this report, but summaries are not shown. For full  details of all interactions, see www.covid19-druginteractions.org. Description of the interactions No clinically significant interaction expected (GREEN) Carrie (5 days) [Please read the interaction details as management of these interactions may  be complex.] + Celecoxib Carrie (5 days) [Please read the interaction details as management of these interactions may be complex.] + Citalopram Carrie (5 days) [Please read the interaction details as management of these interactions may be complex.] + Metformin Carrie (5 days) [Please read the interaction details as management of these interactions may be complex.] + Metoprolol I   Mardella Layman, MD 08/26/20 [phone]

## 2020-08-26 NOTE — ED Triage Notes (Signed)
Cough, congestion and mild diarrhea  x 2 days ago. Positive home covid test.  Would like antiviral meds.

## 2020-08-26 NOTE — Telephone Encounter (Signed)
Noted seen at [facility] today - plz call tomorrow for update. If prescribed paxlovid, to monitor for worsening diarrhea.

## 2020-08-27 NOTE — Telephone Encounter (Signed)
Spoke with pt asking for an update.  States she feels about the same- mild cough and HA, especially HA.  I relayed Dr. Timoteo Expose message.  Pt verbalizes understanding.  Denies any diarrhea at this point.  States she was prescribed Paxlovid but has not started it yet because UC told her it may be better to "ride out" the sxs due to some pt's having rebound COVID taking it.  I advised that she would need to take med within 5 days of start date of sxs, if she decides to take it.  Pt states she will go ahead and pick med.

## 2020-09-07 ENCOUNTER — Telehealth: Payer: Self-pay | Admitting: Family Medicine

## 2020-09-07 NOTE — Telephone Encounter (Signed)
Called pt to r/s appt from 9/28. Pt was concerned about running out of  her thyroid medicine. Pt stated it will not hold her until December appt

## 2020-09-08 MED ORDER — LEVOTHYROXINE SODIUM 150 MCG PO TABS: 150.0000 ug | ORAL_TABLET | Freq: Every day | ORAL | 1 refills | Status: DC

## 2020-09-08 NOTE — Addendum Note (Signed)
Addended by: Eustaquio Boyden on: 09/08/2020 10:50 PM   Modules accepted: Orders

## 2020-09-08 NOTE — Telephone Encounter (Signed)
Plz reschedule sooner - ok to place in open 30 min slot. I have also refilled thyroid medicine.

## 2020-09-10 NOTE — Telephone Encounter (Addendum)
Unable to LVM to call back. Will try call later.

## 2020-09-10 NOTE — Telephone Encounter (Signed)
Second attempt to contact patient, still unable to LVM

## 2020-10-05 ENCOUNTER — Other Ambulatory Visit: Payer: Self-pay

## 2020-10-14 ENCOUNTER — Encounter: Payer: Self-pay | Admitting: Family Medicine

## 2020-11-09 ENCOUNTER — Other Ambulatory Visit: Payer: Self-pay | Admitting: Family Medicine

## 2020-12-23 ENCOUNTER — Other Ambulatory Visit: Payer: PRIVATE HEALTH INSURANCE

## 2020-12-30 ENCOUNTER — Encounter: Payer: PRIVATE HEALTH INSURANCE | Admitting: Family Medicine

## 2021-01-13 DIAGNOSIS — R87610 Atypical squamous cells of undetermined significance on cytologic smear of cervix (ASC-US): Secondary | ICD-10-CM | POA: Insufficient documentation

## 2021-01-27 ENCOUNTER — Encounter: Payer: PRIVATE HEALTH INSURANCE | Admitting: Family Medicine

## 2021-02-08 ENCOUNTER — Telehealth: Payer: Self-pay

## 2021-02-08 DIAGNOSIS — E039 Hypothyroidism, unspecified: Secondary | ICD-10-CM

## 2021-02-08 NOTE — Telephone Encounter (Signed)
Ok to switch. Plz notify pharmacy.  plz notify patient of switch, and need to come in 4-6 wks after starting new formulary for thyroid check. Labs ordered.   

## 2021-02-08 NOTE — Telephone Encounter (Signed)
Received faxed message from [facility] [address] stating Euthyrox is no longer available.  They request permission to change to new manufacturer. Plz advise.

## 2021-02-08 NOTE — Addendum Note (Signed)
Addended by: Ria Bush on: 02/08/2021 05:02 PM   Modules accepted: Orders

## 2021-02-09 NOTE — Telephone Encounter (Signed)
Spoke with Carrie Pham at [facility] [address] notifying her Dr. Reece Agar is agreeing to switch to new manufacturer for thyroid med.  Says she will make the note in pt's info.  Attempted to contact pt.  No answer.  Vm box full.  Need to notify pt of manufacturer change for thyroid med, pills may look different.  Also, per Dr. Reece Agar, due to change pt needs 4-6 wk lab visit for thyroid chk.

## 2021-02-10 NOTE — Telephone Encounter (Signed)
Spoke with Carrie Pham at [facility] notifying her Dr. G is agreeing to switch to new manufacturer for thyroid med.  Says she will make the note in pt's info. ° °Attempted to contact pt.  No answer.  Vm box full.  Need to notify pt of manufacturer change for thyroid med, pills may look different.  Also, per Dr. G, due to change pt needs 4-6 wk lab visit for thyroid chk.  ° ° °

## 2021-02-15 NOTE — Telephone Encounter (Signed)
Spoke with Carrie Pham at [facility] notifying her Dr. G is agreeing to switch to new manufacturer for thyroid med.  Says she will make the note in pt's info. ° °Attempted to contact pt.  No answer.  Vm box full.  Need to notify pt of manufacturer change for thyroid med, pills may look different.  Also, per Dr. G, due to change pt needs 4-6 wk lab visit for thyroid chk.  ° ° °

## 2021-02-16 NOTE — Telephone Encounter (Signed)
Mailing a letter.  

## 2021-05-12 ENCOUNTER — Ambulatory Visit (INDEPENDENT_AMBULATORY_CARE_PROVIDER_SITE_OTHER): Payer: PRIVATE HEALTH INSURANCE | Admitting: Family Medicine

## 2021-05-12 ENCOUNTER — Encounter: Payer: Self-pay | Admitting: Family Medicine

## 2021-05-12 ENCOUNTER — Ambulatory Visit (INDEPENDENT_AMBULATORY_CARE_PROVIDER_SITE_OTHER)
Admission: RE | Admit: 2021-05-12 | Discharge: 2021-05-12 | Disposition: A | Discharge status: Home / Self Care | Payer: PRIVATE HEALTH INSURANCE | Source: Ambulatory Visit | Attending: Family Medicine | Admitting: Family Medicine

## 2021-05-12 VITALS — BP 146/74 | HR 73 | Temp 97.7°F | Ht 65.5 in | Wt 173.0 lb

## 2021-05-12 DIAGNOSIS — M25561 Pain in right knee: Secondary | ICD-10-CM | POA: Diagnosis not present

## 2021-05-12 DIAGNOSIS — E039 Hypothyroidism, unspecified: Secondary | ICD-10-CM

## 2021-05-12 DIAGNOSIS — I10 Essential (primary) hypertension: Secondary | ICD-10-CM | POA: Diagnosis not present

## 2021-05-12 DIAGNOSIS — G8929 Other chronic pain: Secondary | ICD-10-CM

## 2021-05-12 DIAGNOSIS — Z0000 Encounter for general adult medical examination without abnormal findings: Secondary | ICD-10-CM | POA: Insufficient documentation

## 2021-05-12 DIAGNOSIS — E118 Type 2 diabetes mellitus with unspecified complications: Secondary | ICD-10-CM

## 2021-05-12 DIAGNOSIS — M1990 Unspecified osteoarthritis, unspecified site: Secondary | ICD-10-CM

## 2021-05-12 DIAGNOSIS — Z1159 Encounter for screening for other viral diseases: Secondary | ICD-10-CM

## 2021-05-12 DIAGNOSIS — E663 Overweight: Secondary | ICD-10-CM

## 2021-05-12 DIAGNOSIS — Z23 Encounter for immunization: Secondary | ICD-10-CM

## 2021-05-12 DIAGNOSIS — Z0001 Encounter for general adult medical examination with abnormal findings: Secondary | ICD-10-CM

## 2021-05-12 DIAGNOSIS — E669 Obesity, unspecified: Secondary | ICD-10-CM | POA: Insufficient documentation

## 2021-05-12 LAB — CBC WITH DIFFERENTIAL/PLATELET
Basophils Absolute: 0 10*3/uL (ref 0.0–0.1)
Basophils Relative: 0.5 % (ref 0.0–3.0)
Eosinophils Absolute: 0.2 10*3/uL (ref 0.0–0.7)
Eosinophils Relative: 2.4 % (ref 0.0–5.0)
HCT: 37.7 % (ref 36.0–46.0)
Hemoglobin: 12.2 g/dL (ref 12.0–15.0)
Lymphocytes Relative: 36.7 % (ref 12.0–46.0)
Lymphs Abs: 2.4 10*3/uL (ref 0.7–4.0)
MCHC: 32.4 g/dL (ref 30.0–36.0)
MCV: 87.9 fl (ref 78.0–100.0)
Monocytes Absolute: 0.5 10*3/uL (ref 0.1–1.0)
Monocytes Relative: 8.1 % (ref 3.0–12.0)
Neutro Abs: 3.4 10*3/uL (ref 1.4–7.7)
Neutrophils Relative %: 52.3 % (ref 43.0–77.0)
Platelets: 225 10*3/uL (ref 150.0–400.0)
RBC: 4.29 Mil/uL (ref 3.87–5.11)
RDW: 14 % (ref 11.5–15.5)
WBC: 6.6 10*3/uL (ref 4.0–10.5)

## 2021-05-12 LAB — COMPREHENSIVE METABOLIC PANEL
ALT: 10 U/L (ref 0–35)
AST: 14 U/L (ref 0–37)
Albumin: 4.1 g/dL (ref 3.5–5.2)
Alkaline Phosphatase: 87 U/L (ref 39–117)
BUN: 14 mg/dL (ref 6–23)
CO2: 28 mEq/L (ref 19–32)
Calcium: 9.8 mg/dL (ref 8.4–10.5)
Chloride: 103 mEq/L (ref 96–112)
Creatinine, Ser: 0.54 mg/dL (ref 0.40–1.20)
GFR: 99.29 mL/min (ref >60.00)
Glucose, Bld: 92 mg/dL (ref 70–99)
Potassium: 4.5 mEq/L (ref 3.5–5.1)
Sodium: 138 mEq/L (ref 135–145)
Total Bilirubin: 0.4 mg/dL (ref 0.2–1.2)
Total Protein: 7.5 g/dL (ref 6.0–8.3)

## 2021-05-12 LAB — LIPID PANEL
Cholesterol: 159 mg/dL (ref 0–200)
HDL: 39.5 mg/dL (ref >39.00)
LDL Cholesterol: 98 mg/dL (ref 0–99)
NonHDL: 119.2
Total CHOL/HDL Ratio: 4
Triglycerides: 108 mg/dL (ref 0.0–149.0)
VLDL: 21.6 mg/dL (ref 0.0–40.0)

## 2021-05-12 LAB — TSH: TSH: 0.01 u[IU]/mL — ABNORMAL LOW (ref 0.35–5.50)

## 2021-05-12 LAB — MICROALBUMIN / CREATININE URINE RATIO
Creatinine,U: 73.1 mg/dL
Microalb Creat Ratio: 1.6 mg/g (ref 0.0–30.0)
Microalb, Ur: 1.2 mg/dL (ref 0.0–1.9)

## 2021-05-12 LAB — HEMOGLOBIN A1C: Hgb A1c MFr Bld: 6 % (ref 4.6–6.5)

## 2021-05-12 LAB — T4, FREE: Free T4: 1.11 ng/dL (ref 0.60–1.60)

## 2021-05-12 MED ORDER — METOPROLOL TARTRATE 50 MG PO TABS: 50.0000 mg | ORAL_TABLET | Freq: Two times a day (BID) | ORAL | 3 refills | Status: DC

## 2021-05-12 MED ORDER — LEVOTHYROXINE SODIUM 150 MCG PO TABS: 150.0000 ug | ORAL_TABLET | Freq: Every day | ORAL | 3 refills | Status: DC

## 2021-05-12 MED ORDER — CITALOPRAM HYDROBROMIDE 20 MG PO TABS: 20.0000 mg | ORAL_TABLET | Freq: Every day | ORAL | 3 refills | Status: DC

## 2021-05-12 MED ORDER — CELECOXIB 200 MG PO CAPS: 200.0000 mg | ORAL_CAPSULE | Freq: Every day | ORAL | 3 refills | Status: DC

## 2021-05-12 NOTE — Patient Instructions (Addendum)
We will request records of latest colonoscopy with Dr Loreta Ave in [place].  ?Call GYN to schedule mammogram then send [place] report.  ?Xray of right knee today.  ?Labs today.  ?First shingles shot today. Schedule nurse visit in 2-6 months to complete the series ?Consider tetanus shot.  ?Congratulations on weight loss! Continue health diet and lifestyle changes.  ? ?Health Maintenance for Postmenopausal Women ?Menopause is a normal process in which your ability to get pregnant comes to an end. This process happens slowly over many months or years, usually between the ages of 52 and 3. Menopause is complete when you have missed your menstrual period for 12 months. ?It is important to talk with your health care provider about some of the most common conditions that affect women after menopause (postmenopausal women). These include heart disease, cancer, and bone loss (osteoporosis). Adopting a healthy lifestyle and getting preventive care can help to promote your health and wellness. The actions you take can also lower your chances of developing some of these common conditions. ?What are the signs and symptoms of menopause? ?During menopause, you may have the following symptoms: ?Hot flashes. These can be moderate or severe. ?Night sweats. ?Decrease in sex drive. ?Mood swings. ?Headaches. ?Tiredness (fatigue). ?Irritability. ?Memory problems. ?Problems falling asleep or staying asleep. ?Talk with your health care provider about treatment options for your symptoms. ?Do I need hormone replacement therapy? ?Hormone replacement therapy is effective in treating symptoms that are caused by menopause, such as hot flashes and night sweats. ?Hormone replacement carries certain risks, especially as you become older. If you are thinking about using estrogen or estrogen with progestin, discuss the benefits and risks with your health care provider. ?How can I reduce my risk for heart disease and stroke? ?The risk of heart disease,  heart attack, and stroke increases as you age. One of the causes may be a change in the body's hormones during menopause. This can affect how your body uses dietary fats, triglycerides, and cholesterol. Heart attack and stroke are medical emergencies. There are many things that you can do to help prevent heart disease and stroke. ?Watch your blood pressure ?High blood pressure causes heart disease and increases the risk of stroke. This is more likely to develop in people who have high blood pressure readings or are overweight. ?Have your blood pressure checked: ?Every 3-5 years if you are 37-21 years of age. ?Every year if you are 37 years old or older. ?Eat a healthy diet ? ?Eat a diet that includes plenty of vegetables, fruits, low-fat dairy products, and lean protein. ?Do not eat a lot of foods that are high in solid fats, added sugars, or sodium. ?Get regular exercise ?Get regular exercise. This is one of the most important things you can do for your health. Most adults should: ?Try to exercise for at least 150 minutes each week. The exercise should increase your heart rate and make you sweat (moderate-intensity exercise). ?Try to do strengthening exercises at least twice each week. Do these in addition to the moderate-intensity exercise. ?Spend less time sitting. Even light physical activity can be beneficial. ?Other tips ?Work with your health care provider to achieve or maintain a healthy weight. ?Do not use any products that contain nicotine or tobacco. These products include cigarettes, chewing tobacco, and vaping devices, such as e-cigarettes. If you need help quitting, ask your health care provider. ?Know your numbers. Ask your health care provider to check your cholesterol and your blood sugar (glucose). Continue to  have your blood tested as directed by your health care provider. ?Do I need screening for cancer? ?Depending on your health history and family history, you may need to have cancer screenings  at different stages of your life. This may include screening for: ?Breast cancer. ?Cervical cancer. ?Lung cancer. ?Colorectal cancer. ?What is my risk for osteoporosis? ?After menopause, you may be at increased risk for osteoporosis. Osteoporosis is a condition in which bone destruction happens more quickly than new bone creation. To help prevent osteoporosis or the bone fractures that can happen because of osteoporosis, you may take the following actions: ?If you are 14-36 years old, get at least 1,000 mg of calcium and at least 600 international units (IU) of vitamin D per day. ?If you are older than age 38 but younger than age 25, get at least 1,200 mg of calcium and at least 600 international units (IU) of vitamin D per day. ?If you are older than age 73, get at least 1,200 mg of calcium and at least 800 international units (IU) of vitamin D per day. ?Smoking and drinking excessive alcohol increase the risk of osteoporosis. Eat foods that are rich in calcium and vitamin D, and do weight-bearing exercises several times each week as directed by your health care provider. ?How does menopause affect my mental health? ?Depression may occur at any age, but it is more common as you become older. Common symptoms of depression include: ?Feeling depressed. ?Changes in sleep patterns. ?Changes in appetite or eating patterns. ?Feeling an overall lack of motivation or enjoyment of activities that you previously enjoyed. ?Frequent crying spells. ?Talk with your health care provider if you think that you are experiencing any of these symptoms. ?General instructions ?See your health care provider for regular wellness exams and vaccines. This may include: ?Scheduling regular health, dental, and eye exams. ?Getting and maintaining your vaccines. These include: ?Influenza vaccine. Get this vaccine each year before the flu season begins. ?Pneumonia vaccine. ?Shingles vaccine. ?Tetanus, diphtheria, and pertussis (Tdap) booster  vaccine. ?Your health care provider may also recommend other immunizations. ?Tell your health care provider if you have ever been abused or do not feel safe at home. ?Summary ?Menopause is a normal process in which your ability to get pregnant comes to an end. ?This condition causes hot flashes, night sweats, decreased interest in sex, mood swings, headaches, or lack of sleep. ?Treatment for this condition may include hormone replacement therapy. ?Take actions to keep yourself healthy, including exercising regularly, eating a healthy diet, watching your weight, and checking your blood pressure and blood sugar levels. ?Get screened for cancer and depression. Make sure that you are up to date with all your vaccines. ?This information is not intended to replace advice given to you by your health care provider. Make sure you discuss any questions you have with your health care provider. ?Document Revised: 05/25/2020 Document Reviewed: 05/25/2020 ?Elsevier Patient Education ? 2023 [employer]vier Inc[employer] ? ?

## 2021-05-12 NOTE — Assessment & Plan Note (Signed)
Anticipate due to primary osteoarthritis of R knee.  ?Baseline films today, discussed possible ortho referral pending results.  ?

## 2021-05-12 NOTE — Assessment & Plan Note (Signed)
Chronic, doesn't check sugars. Anticipate great control with significant weight loss noted. Will check A1c and drop metformin to 1/2 tab of 1000mg  daily  ?

## 2021-05-12 NOTE — Assessment & Plan Note (Signed)
Chronic, deteriorated despite metoprolol 50mg  BID - will ask her to start monitoring blood pressures at home and let know if consistently >140/90. Elevation may be driven by increased knee pain.   ?

## 2021-05-12 NOTE — Addendum Note (Signed)
Addended by: Nanci Pina on: 05/12/2021 10:45 AM ? ? Modules accepted: Orders ? ?

## 2021-05-12 NOTE — Assessment & Plan Note (Signed)
Overdue for recheck - update thyroid function on lower levothyroxine dose.  ?

## 2021-05-12 NOTE — Assessment & Plan Note (Signed)
She is on celebrex 200mg  daily for this.  ?

## 2021-05-12 NOTE — Assessment & Plan Note (Signed)
Congratulated on almost 30 lb intentional weight loss over the past year - she is motivated to continue healthy changes.  ?

## 2021-05-12 NOTE — Progress Notes (Signed)
? ? Patient ID: Carrie Pham, female    DOB: 07/26/1960, 61 y.o.   MRN: [id number] ? ?This visit was conducted in person. ? ?BP (!) 146/74 (BP Location: Right Arm, Cuff Size: Normal)   Pulse 73   Temp 97.7 ?F (36.5 ?C) (Temporal)   Ht 5' 5.5" (1.664 m)   Wt 173 lb (78.5 kg)   SpO2 98%   BMI 28.35 kg/m?   ? ?CC: CPE ?Subjective:  ? ?HPI: ?Carrie Pham is a 61 y.o. female presenting on 05/12/2021 for Annual Exam ? ? ?Last seen 02/2020.  ?Notes ongoing constant pain to R knee progressively worsening but present for years, more pain after prolonged sitting. Points to medial knee joint. No groin pain or hip pain. Leg swelling has improved.  ? ?DM - continues metformin 1000mg  daily.  ? ?Had a sip of coke this morning.  ? ?30 lb weight loss in the past year! She is following program.  ? ?Preventative: ?Colonoscopy 7-8 yrs ago - normal Animal nutritionist) - records requested ?Breast cancer screening - last mammo 2022, due for rpt through GYN  ?Well woman exam - with OBGYN Dr 2023 - last seen 11/2020. Postmenopausal. Normal pap this year.  ?DEXA scan -  ?Lung cancer screening - not due ?Flu shot - yearly ?COVID vaccine Pfizer 04/2019 x2, booster 11/2019, bivalent 08/2020 ?Tetanus shot - unsure  ?Pneumonia shot - not due ?Shingrix - discussed ?Advanced directive discussion -  ?Seat belt use discussed ?Sunscreen use discussed. No changing moles on skin. ?Non smoker ?Alcohol - seldom ?Dentist - q6 mo ?Eye exam - yearly - cataract surgery 2 yrs ago  ?Bowel -  ?Bladder -  ? ?Lives with husband ?Occ: processes mortgage loans  ?Activity: joined gym going 3-4 /wk ?Diet: poor water, good fruits/vegetables  ?   ? ?Relevant past medical, surgical, family and social history reviewed and updated as indicated. Interim medical history since our last visit reviewed. ?Allergies and medications reviewed and updated. ?Outpatient Medications Prior to Visit  ?Medication Sig Dispense Refill  ? fluconazole (DIFLUCAN) 100 MG tablet Take  100 mg by mouth daily. As needed    ? celecoxib (CELEBREX) 200 MG capsule Take 1 capsule by mouth once daily 90 capsule 0  ? citalopram (CELEXA) 20 MG tablet Take 1 tablet (20 mg total) by mouth daily. 90 tablet 1  ? levothyroxine (EUTHYROX) 150 MCG tablet Take 1 tablet (150 mcg total) by mouth daily before breakfast. 90 tablet 1  ? metFORMIN (GLUCOPHAGE) 1000 MG tablet Take 1 tablet (1,000 mg total) by mouth daily with breakfast. 90 tablet 1  ? metoprolol tartrate (LOPRESSOR) 50 MG tablet Take 1 tablet (50 mg total) by mouth 2 (two) times daily. 180 tablet 1  ? metFORMIN (GLUCOPHAGE) 1000 MG tablet Take 0.5 tablets (500 mg total) by mouth daily with breakfast.    ? ?No facility-administered medications prior to visit.  ?  ? ?Per HPI unless specifically indicated in ROS section below ?Review of Systems  ?Constitutional:  Negative for activity change, appetite change, chills, fatigue, fever and unexpected weight change.  ?HENT:  Negative for hearing loss.   ?Eyes:  Negative for visual disturbance.  ?Respiratory:  Negative for cough, chest tightness, shortness of breath and wheezing.   ?Cardiovascular:  Negative for chest pain, palpitations and leg swelling.  ?Gastrointestinal:  Negative for abdominal distention, abdominal pain, blood in stool, constipation, diarrhea, nausea and vomiting.  ?Genitourinary:  Negative for difficulty urinating and hematuria.  ?Musculoskeletal:  Negative for arthralgias, myalgias and neck pain.  ?Skin:  Negative for rash.  ?Neurological:  Negative for dizziness, seizures, syncope and headaches.  ?Hematological:  Negative for adenopathy. Does not bruise/bleed easily.  ?Psychiatric/Behavioral:  Negative for dysphoric mood. The patient is not nervous/anxious.   ? ?Objective:  ?BP (!) 146/74 (BP Location: Right Arm, Cuff Size: Normal)   Pulse 73   Temp 97.7 ?F (36.5 ?C) (Temporal)   Ht 5' 5.5" (1.664 m)   Wt 173 lb (78.5 kg)   SpO2 98%   BMI 28.35 kg/m?   ?Wt Readings from Last 3  Encounters:  ?05/12/21 173 lb (78.5 kg)  ?02/24/20 200 lb 9 oz (91 kg)  ?  ?  ?Physical Exam ?Vitals and nursing note reviewed.  ?Constitutional:   ?   Appearance: Normal appearance. She is not ill-appearing.  ?HENT:  ?   Head: Normocephalic and atraumatic.  ?   Right Ear: Tympanic membrane, ear canal and external ear normal. There is no impacted cerumen.  ?   Left Ear: Tympanic membrane, ear canal and external ear normal. There is no impacted cerumen.  ?Eyes:  ?   General:     ?   Right eye: No discharge.     ?   Left eye: No discharge.  ?   Extraocular Movements: Extraocular movements intact.  ?   Conjunctiva/sclera: Conjunctivae normal.  ?   Pupils: Pupils are equal, round, and reactive to light.  ?Neck:  ?   Thyroid: No thyroid mass or thyromegaly.  ?Cardiovascular:  ?   Rate and Rhythm: Normal rate and regular rhythm.  ?   Pulses: Normal pulses.  ?   Heart sounds: Normal heart sounds. No murmur heard. ?Pulmonary:  ?   Effort: Pulmonary effort is normal. No respiratory distress.  ?   Breath sounds: Normal breath sounds. No wheezing, rhonchi or rales.  ?Abdominal:  ?   General: Bowel sounds are normal. There is no distension.  ?   Palpations: Abdomen is soft. There is no mass.  ?   Tenderness: There is no abdominal tenderness. There is no guarding or rebound.  ?   Hernia: No hernia is present.  ?Musculoskeletal:  ?   Cervical back: Normal range of motion and neck supple. No rigidity.  ?   Right lower leg: No edema.  ?   Left lower leg: No edema.  ?Lymphadenopathy:  ?   Cervical: No cervical adenopathy.  ?Skin: ?   General: Skin is warm and dry.  ?   Findings: No rash.  ?Neurological:  ?   General: No focal deficit present.  ?   Mental Status: She is alert. Mental status is at baseline.  ?Psychiatric:     ?   Mood and Affect: Mood normal.     ?   Behavior: Behavior normal.  ? ?   ?Results for orders placed or performed in visit on 02/24/20  ?Lipid panel  ?Result Value Ref Range  ? Cholesterol 141 0 - 200 mg/dL  ?  Triglycerides 89.0 0.0 - 149.0 mg/dL  ? HDL 38.80 (L) >39.00 mg/dL  ? VLDL 17.8 0.0 - 40.0 mg/dL  ? LDL Cholesterol 85 0 - 99 mg/dL  ? Total CHOL/HDL Ratio 4   ? NonHDL 102.48   ?Comprehensive metabolic panel  ?Result Value Ref Range  ? Sodium 139 135 - 145 mEq/L  ? Potassium 4.5 3.5 - 5.1 mEq/L  ? Chloride 105 96 - 112 mEq/L  ? CO2 27 19 -  32 mEq/L  ? Glucose, Bld 122 (H) 70 - 99 mg/dL  ? BUN 13 6 - 23 mg/dL  ? Creatinine, Ser 0.53 0.40 - 1.20 mg/dL  ? Total Bilirubin 0.3 0.2 - 1.2 mg/dL  ? Alkaline Phosphatase 85 39 - 117 U/L  ? AST 22 0 - 37 U/L  ? ALT 26 0 - 35 U/L  ? Total Protein 6.9 6.0 - 8.3 g/dL  ? Albumin 3.8 3.5 - 5.2 g/dL  ? GFR 100.59 >60.00 mL/min  ? Calcium 9.5 8.4 - 10.5 mg/dL  ?TSH  ?Result Value Ref Range  ? TSH <0.01 Repeated and verified X2. (L) 0.35 - 4.50 uIU/mL  ?CBC with Differential/Platelet  ?Result Value Ref Range  ? WBC 6.1 4.0 - 10.5 K/uL  ? RBC 4.46 3.87 - 5.11 Mil/uL  ? Hemoglobin 12.3 12.0 - 15.0 g/dL  ? HCT 37.8 36.0 - 46.0 %  ? MCV 84.8 78.0 - 100.0 fl  ? MCHC 32.5 30.0 - 36.0 g/dL  ? RDW 14.1 11.5 - 15.5 %  ? Platelets 257.0 150.0 - 400.0 K/uL  ? Neutrophils Relative % 51.2 43.0 - 77.0 %  ? Lymphocytes Relative 38.3 12.0 - 46.0 %  ? Monocytes Relative 8.3 3.0 - 12.0 %  ? Eosinophils Relative 1.6 0.0 - 5.0 %  ? Basophils Relative 0.6 0.0 - 3.0 %  ? Neutro Abs 3.1 1.4 - 7.7 K/uL  ? Lymphs Abs 2.3 0.7 - 4.0 K/uL  ? Monocytes Absolute 0.5 0.1 - 1.0 K/uL  ? Eosinophils Absolute 0.1 0.0 - 0.7 K/uL  ? Basophils Absolute 0.0 0.0 - 0.1 K/uL  ?Ferritin  ?Result Value Ref Range  ? Ferritin 26.0 10.0 - 291.0 ng/mL  ?IBC panel  ?Result Value Ref Range  ? Iron 67 42 - 145 ug/dL  ? Transferrin 330.0 212.0 - 360.0 mg/dL  ? Saturation Ratios 14.5 (L) 20.0 - 50.0 %  ?Hemoglobin A1c  ?Result Value Ref Range  ? Hgb A1c MFr Bld 6.4 4.6 - 6.5 %  ? ? ?Assessment & Plan:  ? ?Problem List Items Addressed This Visit   ? ? Encounter for general adult medical examination with abnormal findings - Primary  (Chronic)  ?  Preventative protocols reviewed and updated unless pt declined. ?Discussed healthy diet and lifestyle.  ? ?  ?  ? Controlled diabetes mellitus type 2 with complications (HCC)  ?  Chronic, doesn't check sugars.

## 2021-05-12 NOTE — Assessment & Plan Note (Signed)
Preventative protocols reviewed and updated unless pt declined. Discussed healthy diet and lifestyle.  

## 2021-05-13 ENCOUNTER — Other Ambulatory Visit: Payer: Self-pay | Admitting: Family Medicine

## 2021-05-13 DIAGNOSIS — E039 Hypothyroidism, unspecified: Secondary | ICD-10-CM

## 2021-05-13 LAB — HEPATITIS C ANTIBODY
Hepatitis C Ab: NONREACTIVE
SIGNAL TO CUT-OFF: 0.03 (ref <1.00)

## 2021-05-13 MED ORDER — LEVOTHYROXINE SODIUM 125 MCG PO TABS: 125.0000 ug | ORAL_TABLET | Freq: Every day | ORAL | 6 refills | Status: DC

## 2021-05-27 ENCOUNTER — Other Ambulatory Visit: Payer: Self-pay | Admitting: Family Medicine

## 2021-05-27 DIAGNOSIS — G8929 Other chronic pain: Secondary | ICD-10-CM

## 2021-05-27 NOTE — Progress Notes (Signed)
Ortho referral placed

## 2021-06-10 ENCOUNTER — Telehealth: Payer: Self-pay | Admitting: Family Medicine

## 2021-06-10 NOTE — Telephone Encounter (Signed)
Pt called and wanted the nurse "to know that she has not gotten any information about her orthopedic referral, wants to speak to someone/nurse to see if they have found any information about it." Please return a call when possible, thanks.  Callback Number: [id number]

## 2021-06-28 ENCOUNTER — Other Ambulatory Visit: Payer: PRIVATE HEALTH INSURANCE

## 2021-06-29 ENCOUNTER — Other Ambulatory Visit (INDEPENDENT_AMBULATORY_CARE_PROVIDER_SITE_OTHER): Payer: PRIVATE HEALTH INSURANCE

## 2021-06-29 DIAGNOSIS — M25562 Pain in left knee: Secondary | ICD-10-CM | POA: Diagnosis not present

## 2021-06-29 DIAGNOSIS — G8929 Other chronic pain: Secondary | ICD-10-CM | POA: Diagnosis not present

## 2021-06-29 DIAGNOSIS — E039 Hypothyroidism, unspecified: Secondary | ICD-10-CM | POA: Diagnosis not present

## 2021-06-29 DIAGNOSIS — M25561 Pain in right knee: Secondary | ICD-10-CM | POA: Diagnosis not present

## 2021-06-29 DIAGNOSIS — M170 Bilateral primary osteoarthritis of knee: Secondary | ICD-10-CM | POA: Diagnosis not present

## 2021-06-29 LAB — TSH: TSH: 0.4 u[IU]/mL (ref 0.35–5.50)

## 2021-07-01 NOTE — Telephone Encounter (Signed)
Pt seen 06/29/2021 with [facility] Dr Francesco Sor  Nothing further needed.

## 2021-08-02 DIAGNOSIS — M1711 Unilateral primary osteoarthritis, right knee: Secondary | ICD-10-CM | POA: Diagnosis not present

## 2021-09-09 DIAGNOSIS — M1712 Unilateral primary osteoarthritis, left knee: Secondary | ICD-10-CM | POA: Diagnosis not present

## 2021-09-09 DIAGNOSIS — M1711 Unilateral primary osteoarthritis, right knee: Secondary | ICD-10-CM | POA: Diagnosis not present

## 2021-09-12 DIAGNOSIS — M1711 Unilateral primary osteoarthritis, right knee: Secondary | ICD-10-CM | POA: Insufficient documentation

## 2021-09-12 DIAGNOSIS — M1712 Unilateral primary osteoarthritis, left knee: Secondary | ICD-10-CM | POA: Insufficient documentation

## 2021-10-08 NOTE — Discharge Instructions (Signed)
Instructions after Total Knee Replacement   Yardley Lekas P. Baleigh Rennaker, Jr., M.D.     Dept. of Orthopaedics & Sports Medicine  [facility]  [address]  [place], [place]  [address]  Phone: [phone]   Fax: [phone]    DIET: Drink plenty of non-alcoholic fluids. Resume your normal diet. Include foods high in fiber.  ACTIVITY:  You may use crutches or a walker with weight-bearing as tolerated, unless instructed otherwise. You may be weaned off of the walker or crutches by your Physical Therapist.  Do NOT place pillows under the knee. Anything placed under the knee could limit your ability to straighten the knee.   Continue doing gentle exercises. Exercising will reduce the pain and swelling, increase motion, and prevent muscle weakness.   Please continue to use the TED compression stockings for 6 weeks. You may remove the stockings at night, but should reapply them in the morning. Do not drive or operate any equipment until instructed.  WOUND CARE:  Continue to use the PolarCare or ice packs periodically to reduce pain and swelling. You may bathe or shower after the staples are removed at the first office visit following surgery.  MEDICATIONS: You may resume your regular medications. Please take the pain medication as prescribed on the medication. Do not take pain medication on an empty stomach. You have been given a prescription for a blood thinner (Lovenox or Coumadin). Please take the medication as instructed. (NOTE: After completing a 2 week course of Lovenox, take one Enteric-coated aspirin once a day. This along with elevation will help reduce the possibility of phlebitis in your operated leg.) Do not drive or drink alcoholic beverages when taking pain medications.  CALL THE OFFICE FOR: Temperature above 101 degrees Excessive bleeding or drainage on the dressing. Excessive swelling, coldness, or paleness of the toes. Persistent nausea and vomiting.  FOLLOW-UP:  You  should have an appointment to return to the office in 10-14 days after surgery. Arrangements have been made for continuation of Physical Therapy (either home therapy or outpatient therapy).   [facility] Department Directory         [email or web address]       [email or web address]          Cardiology  Appointments: [place] - [phone] [place] - [phone]  Endocrinology  Appointments: [place] - [phone] [place] - [phone]  Gastroenterology  Appointments: [place] - [phone] [place] - [phone]        General Surgery   Appointments: [place] - [phone]  Internal Medicine/Family Medicine  Appointments: [place] - [phone] [place] - [phone] [place] - [phone]  Metabolic and Weigh Loss Surgery  Appointments: [place] - [phone]        Neurology  Appointments: [place] - [phone] [place] - [phone]  Neurosurgery  Appointments: [place] - [phone]  Obstetrics & Gynecology  Appointments: [place] - [phone] [place] - [phone]        Pediatrics  Appointments: [place] - [phone] [place] - [phone]  Physiatry  Appointments: [place] -[phone]  Physical Therapy  Appointments: [place] - [phone] [place] - [phone]        Podiatry  Appointments: [place] - [phone] [place] - [phone]  Pulmonology  Appointments: [place] - [phone]  Rheumatology  Appointments: [place] - [phone]        [place] Location: [facility]  [address] , [place]  [address]  [place] Location: [facility] [address] [place], [place]  [address]  [place] Location: [facility] [address] [place],   [address]    

## 2021-10-20 ENCOUNTER — Other Ambulatory Visit: Payer: Self-pay

## 2021-10-20 ENCOUNTER — Encounter
Admission: RE | Admit: 2021-10-20 | Discharge: 2021-10-20 | Disposition: A | Discharge status: Home / Self Care | Payer: 59 | Source: Ambulatory Visit | Attending: Orthopedic Surgery | Admitting: Orthopedic Surgery

## 2021-10-20 VITALS — BP 127/70 | HR 63 | Resp 14 | Ht 66.5 in | Wt 182.0 lb

## 2021-10-20 DIAGNOSIS — Z01818 Encounter for other preprocedural examination: Secondary | ICD-10-CM | POA: Insufficient documentation

## 2021-10-20 DIAGNOSIS — M1711 Unilateral primary osteoarthritis, right knee: Secondary | ICD-10-CM | POA: Diagnosis not present

## 2021-10-20 DIAGNOSIS — R8290 Unspecified abnormal findings in urine: Secondary | ICD-10-CM | POA: Insufficient documentation

## 2021-10-20 DIAGNOSIS — E118 Type 2 diabetes mellitus with unspecified complications: Secondary | ICD-10-CM | POA: Diagnosis not present

## 2021-10-20 DIAGNOSIS — E039 Hypothyroidism, unspecified: Secondary | ICD-10-CM | POA: Insufficient documentation

## 2021-10-20 DIAGNOSIS — Z22322 Carrier or suspected carrier of Methicillin resistant Staphylococcus aureus: Secondary | ICD-10-CM

## 2021-10-20 DIAGNOSIS — Z01812 Encounter for preprocedural laboratory examination: Secondary | ICD-10-CM

## 2021-10-20 DIAGNOSIS — Z01810 Encounter for preprocedural cardiovascular examination: Secondary | ICD-10-CM | POA: Diagnosis not present

## 2021-10-20 HISTORY — DX: Carrier or suspected carrier of methicillin resistant Staphylococcus aureus: Z22.322

## 2021-10-20 HISTORY — DX: Prediabetes: R73.03

## 2021-10-20 LAB — URINALYSIS, ROUTINE W REFLEX MICROSCOPIC
Bilirubin Urine: NEGATIVE
Glucose, UA: NEGATIVE mg/dL
Ketones, ur: NEGATIVE mg/dL
Nitrite: NEGATIVE
Protein, ur: NEGATIVE mg/dL
Specific Gravity, Urine: 1.021 (ref 1.005–1.030)
pH: 5 (ref 5.0–8.0)

## 2021-10-20 LAB — CBC
HCT: 39.1 % (ref 36.0–46.0)
Hemoglobin: 12.1 g/dL (ref 12.0–15.0)
MCH: 27.9 pg (ref 26.0–34.0)
MCHC: 30.9 g/dL (ref 30.0–36.0)
MCV: 90.1 fL (ref 80.0–100.0)
Platelets: 262 10*3/uL (ref 150–400)
RBC: 4.34 MIL/uL (ref 3.87–5.11)
RDW: 14.7 % (ref 11.5–15.5)
WBC: 6.1 10*3/uL (ref 4.0–10.5)
nRBC: 0 % (ref 0.0–0.2)

## 2021-10-20 LAB — COMPREHENSIVE METABOLIC PANEL
ALT: 11 U/L (ref 0–44)
AST: 18 U/L (ref 15–41)
Albumin: 3.7 g/dL (ref 3.5–5.0)
Alkaline Phosphatase: 77 U/L (ref 38–126)
Anion gap: 6 (ref 5–15)
BUN: 17 mg/dL (ref 8–23)
CO2: 25 mmol/L (ref 22–32)
Calcium: 9.4 mg/dL (ref 8.9–10.3)
Chloride: 108 mmol/L (ref 98–111)
Creatinine, Ser: 0.64 mg/dL (ref 0.44–1.00)
GFR, Estimated: >60 mL/min (ref >60)
Glucose, Bld: 120 mg/dL — ABNORMAL HIGH (ref 70–99)
Potassium: 4 mmol/L (ref 3.5–5.1)
Sodium: 139 mmol/L (ref 135–145)
Total Bilirubin: 0.4 mg/dL (ref 0.3–1.2)
Total Protein: 7.5 g/dL (ref 6.5–8.1)

## 2021-10-20 LAB — SURGICAL PCR SCREEN
MRSA, PCR: POSITIVE — AB
Staphylococcus aureus: POSITIVE — AB

## 2021-10-20 LAB — C-REACTIVE PROTEIN: CRP: 0.7 mg/dL (ref <1.0)

## 2021-10-20 LAB — HEMOGLOBIN A1C
Hgb A1c MFr Bld: 5.6 % (ref 4.8–5.6)
Mean Plasma Glucose: 114.02 mg/dL

## 2021-10-20 LAB — SEDIMENTATION RATE: Sed Rate: 20 mm/hr (ref 0–30)

## 2021-10-20 NOTE — Progress Notes (Signed)
  Perioperative Services  Abnormal Lab Notification and Treatment Plan of Care  Date: 10/20/21  Name: Carrie Pham MRN:   [id number]  Re: Abnormal labs noted during PAT appointment  Provider Notified: Dereck Leep, MD Notification mode: Routed and/or faxed via [place] of concern: Lab Results  Component Value Date   STAPHAUREUS POSITIVE (A) 10/20/2021   MRSAPCR POSITIVE (A) 10/20/2021    Notes: Patient is scheduled for a COMPUTER ASSISTED TOTAL KNEE ARTHROPLASTY (Right: Knee) on 10/27/2021. She is scheduled to receive CEFAZOLIN pre-operatively. Surgical PCR (+) for MRSA; see above.  PLANS:  Carrie Pham is scheduled for the above procedure with Dr. Skip Estimable, MD.  Preoperative testing revealed a (+) MRSA PCR screen. Patient will need additional preoperative antimicrobial coverage for this pathogen.  Sending result to primary attending surgeon for review.  Surgeon to place further orders as deemed appropriate for this case.  Honor Loh, MSN, APRN, FNP-C, CEN [facility]  Peri-operative Services Nurse Practitioner Phone: [phone] 10/20/21 12:33 PM

## 2021-10-20 NOTE — Patient Instructions (Signed)
Your procedure is scheduled on: 10/27/21 Report to [place]. To find out your arrival time please call [phone] between 1PM - 3PM on 10/26/21.  Remember: Instructions that are not followed completely may result in serious medical risk, up to and including death, or upon the discretion of your surgeon and anesthesiologist your surgery may need to be rescheduled.     _X__ 1. Do not eat food after midnight the night before your procedure.                 No gum chewing or hard candies. You may drink clear liquids up to 2 hours                 before you are scheduled to arrive for your surgery- DO not drink clear                 liquids within 2 hours of the start of your surgery.                 Clear Liquids include:  water, apple juice without pulp, clear carbohydrate                 drink such as Clearfast or Gatorade, Black Coffee or Tea (Do not add                 anything to coffee or tea). Diabetics water only  Drink the Ensure "clear" pre surgery drink 2 hours prior to your arrival for surgery  __X__2.  On the morning of surgery brush your teeth with toothpaste and water, you                 may rinse your mouth with mouthwash if you wish.  Do not swallow any              toothpaste of mouthwash.     _X__ 3.  No Alcohol for 24 hours before or after surgery.   _X__ 4.  Do Not Smoke or use e-cigarettes For 24 Hours Prior to Your Surgery.                 Do not use any chewable tobacco products for at least 6 hours prior to                 surgery.  ____  5.  Bring all medications with you on the day of surgery if instructed.   __X__  6.  Notify your doctor if there is any change in your medical condition      (cold, fever, infections).     Do not wear jewelry, make-up, hairpins, clips or nail polish. Do not wear lotions, powders, or perfumes. You may wear deodorant Do not shave body hair 48 hours prior to surgery. Men  may shave face and neck. Do not bring valuables to the hospital.    [facility] is not responsible for any belongings or valuables.  Contacts, dentures/partials or body piercings may not be worn into surgery. Bring a case for your contacts, glasses or hearing aids, a denture cup will be supplied. Leave your suitcase in the car. After surgery it may be brought to your room. For patients admitted to the hospital, discharge time is determined by your treatment team.   Patients discharged the day of surgery will not be allowed to drive home.   Please read over the following fact sheets that you were  given:   MRSA Information, ChG soap, Incentive Spirometer, Ensure  __X__ Take these medicines the morning of surgery with A SIP OF WATER:    1. citalopram (CELEXA) 20 MG tablet  2. levothyroxine (EUTHYROX) 125 MCG tablet  3. metoprolol tartrate (LOPRESSOR) 50 MG tablet  4.  5.  6.  ____ Fleet Enema (as directed)   __X__ Use CHG Soap/SAGE wipes as directed  ____ Use inhalers on the day of surgery  __x__ Stop metformin/Janumet/Farxiga 2 days prior to surgery  Last dose will be Monday morning 10/09  ____ Take 1/2 of usual insulin dose the night before surgery. No insulin the morning          of surgery.   ____ Stop Blood Thinners Coumadin/Plavix/Xarelto/Pleta/Pradaxa/Eliquis/Effient/Aspirin  on   Or contact your Surgeon, Cardiologist or Medical Doctor regarding  ability to stop your blood thinners  __X__ Stop Anti-inflammatories 7 days before surgery such as Advil, Ibuprofen, Motrin,  BC or Goodies Powder, Naprosyn, Naproxen, Aleve, Aspirin   OK to continue your Tylenol as needed  __X__ Stop all herbals and supplements, fish oil or vitamins for 7 days until after surgery.    ____ Bring C-Pap to the hospital.

## 2021-10-21 DIAGNOSIS — M1711 Unilateral primary osteoarthritis, right knee: Secondary | ICD-10-CM | POA: Diagnosis not present

## 2021-10-21 LAB — URINE CULTURE: Culture: <10000 — AB

## 2021-10-24 ENCOUNTER — Encounter: Payer: Self-pay | Admitting: Orthopedic Surgery

## 2021-10-24 NOTE — H&P (Signed)
ORTHOPAEDIC HISTORY & PHYSICAL Gwenlyn Fudge, [place] - 10/21/2021 10:45 AM EDT Formatting of this note is different from the original. [facility] Chief Complaint:   Chief Complaint  Patient presents with  Knee Pain  H & P RIGHT KNEE   History of Present Illness:   Carrie Pham is a 61 y.o. female that presents to clinic today for her preoperative history and evaluation. Patient presents unaccompanied. The patient is scheduled to undergo a right total knee arthroplasty on 10/27/2021 by Dr. Marry Guan. Her pain began several years ago. The pain is located primarily along the medial aspect of the knee. She describes her pain as worse with weightbearing and rising after sitting. She reports associated swelling, some locking, and some giving way of the knees. She denies associated numbness or tingling.   The patient's symptoms have progressed to the point that they decrease her quality of life. The patient has previously undergone conservative treatment including NSAIDS and injections to the knee without adequate control of her symptoms. Last A1c was 5.6.  Denies significant cardiac history, history of lumbar surgery, or DVT. Her husband will be home to assist after surgery.   Past Medical, Surgical, Family, Social History, Allergies, Medications:   Past Medical History:  Past Medical History:  Diagnosis Date  Diabetes mellitus without complication (CMS-HCC)  Hypertension  Hypothyroidism   Past Surgical History:  Past Surgical History:  Procedure Laterality Date  CESAREAN SECTION  HYSTERECTOMY   Current Medications:  Current Outpatient Medications  Medication Sig Dispense Refill  acetaminophen (TYLENOL) 500 MG tablet Take 1,000 mg by mouth 2 (two) times daily as needed for Pain  celecoxib (CELEBREX) 200 MG capsule Take 200 mg by mouth once daily  citalopram (CELEXA) 20 MG tablet Take 20 mg by mouth once daily  fluconazole (DIFLUCAN)  100 MG tablet Take 100 mg by mouth once daily as needed  levothyroxine (SYNTHROID) 125 MCG tablet Take 125 mcg by mouth every morning before breakfast  melatonin 10 mg Tab Take 1 tablet by mouth at bedtime as needed  metFORMIN (GLUCOPHAGE) 500 MG tablet Take 500 mg by mouth daily with breakfast  metoprolol tartrate (LOPRESSOR) 50 MG tablet Take 50 mg by mouth 2 (two) times daily  naproxen sodium (ALEVE) 220 MG tablet Take 440 mg by mouth once daily as needed  valACYclovir (VALTREX) 1000 MG tablet Take 1,000 mg by mouth once daily  levothyroxine (SYNTHROID) 150 MCG tablet Take 150 mcg by mouth once daily Take on an empty stomach with a glass of water at least 30-60 minutes before breakfast.  metFORMIN (GLUCOPHAGE) 1000 MG tablet Take 1,000 mg by mouth daily with breakfast   No current facility-administered medications for this visit.   Allergies:  Allergies  Allergen Reactions  Codeine Itching   Social History:  Social History   Socioeconomic History  Marital status: Married  Spouse name: Delfina Redwood  Number of children: 3  Years of education: 12  Highest education level: High school graduate  Occupational History  Occupation: Animator - Chiropractor- Investment banker, operational  Tobacco Use  Smoking status: Never  Smokeless tobacco: Never  Vaping Use  Vaping Use: Never used  Substance and Sexual Activity  Alcohol use: Not Currently  Comment: rarely  Drug use: Never  Sexual activity: Yes  Partners: Male   Family History: History reviewed. No pertinent family history.  Review of Systems:   A 10+ ROS was performed, reviewed, and the pertinent orthopaedic findings are documented  in the HPI.   Physical Examination:   BP 130/86 (BP Location: Left upper arm, Patient Position: Sitting, BP Cuff Size: Large Adult)  Ht 165.1 cm (5\' 5" )  Wt 83.1 kg (183 lb 3.2 oz)  BMI 30.49 kg/m   Patient is a well-developed, well-nourished female in no acute distress. Patient has normal  mood and affect. Patient is alert and oriented to person, place, and time.   HEENT: Atraumatic, normocephalic. Pupils equal and reactive to light. Extraocular motion intact. Noninjected sclera.  Cardiovascular: Regular rate and rhythm, with no murmurs, rubs, or gallops. Distal pulses palpable. No carotid bruits.  Respiratory: Lungs clear to auscultation bilaterally.   Right Knee: Soft tissue swelling: minimal Effusion: minimal Erythema: none Crepitance: mild Tenderness: medial Alignment: relative varus Mediolateral laxity: medial pseudolaxity Posterior sag: negative Patellar tracking: Good tracking without evidence of subluxation or tilt Atrophy: No significant atrophy.  Quadriceps tone was good. Range of motion: 0/8/118 degrees  Patient able to actively dorsiflex and plantarflex the right ankle. Able to flex and extend the toes.  Sensation intact over the saphenous, lateral sural cutaneous, superficial fibular, and deep fibular nerve distributions.  Tests Performed/Reviewed:  X-rays  3 views of the right knee were obtained. Images reveal severe loss of medial compartment joint space with significant osteophyte formation. No fractures or dislocations. No osseous abnormality noted.  I personally ordered and interpreted today's x-rays.  Impression:   ICD-10-CM  1. Primary osteoarthritis of right knee M17.11   Plan:   The patient has end-stage degenerative changes of the right knee. It was explained to the patient that the condition is progressive in nature. Having failed conservative treatment, the patient has elected to proceed with a total joint arthroplasty. The patient will undergo a total joint arthroplasty with Dr. 12-05-1999. The risks of surgery, including blood clot and infection, were discussed with the patient. Measures to reduce these risks, including the use of anticoagulation, perioperative antibiotics, and early ambulation were discussed. The importance of  postoperative physical therapy was discussed with the patient. The patient elects to proceed with surgery. The patient is instructed to stop all blood thinners prior to surgery. The patient is instructed to call the hospital the day before surgery to learn of the proper arrival time.   Contact our office with any questions or concerns. Follow up as indicated, or sooner should any new problems arise, if conditions worsen, or if they are otherwise concerned.   Ernest Pine, PA -C [facility] [address] [place], [place] Phone: [phone]  This note was generated in part with voice recognition software and I apologize for any typographical errors that were not detected and corrected.  Electronically signed by [phone], PA at 10/21/2021 5:36 PM EDT

## 2021-10-27 ENCOUNTER — Observation Stay
Admission: RE | Admit: 2021-10-27 | Discharge: 2021-10-28 | Disposition: A | Discharge status: Home / Self Care | Payer: 59 | Attending: Orthopedic Surgery | Admitting: Orthopedic Surgery

## 2021-10-27 ENCOUNTER — Ambulatory Visit: Payer: 59 | Admitting: Anesthesiology

## 2021-10-27 ENCOUNTER — Ambulatory Visit: Payer: 59 | Admitting: Urgent Care

## 2021-10-27 ENCOUNTER — Other Ambulatory Visit: Payer: Self-pay

## 2021-10-27 ENCOUNTER — Observation Stay: Payer: 59

## 2021-10-27 ENCOUNTER — Encounter
Admission: RE | Disposition: A | Discharge status: Home / Self Care | Payer: Self-pay | Source: Home / Self Care | Attending: Orthopedic Surgery

## 2021-10-27 ENCOUNTER — Encounter: Payer: Self-pay | Admitting: Orthopedic Surgery

## 2021-10-27 DIAGNOSIS — Z96651 Presence of right artificial knee joint: Secondary | ICD-10-CM

## 2021-10-27 DIAGNOSIS — Z7984 Long term (current) use of oral hypoglycemic drugs: Secondary | ICD-10-CM | POA: Diagnosis not present

## 2021-10-27 DIAGNOSIS — I10 Essential (primary) hypertension: Secondary | ICD-10-CM | POA: Diagnosis not present

## 2021-10-27 DIAGNOSIS — E118 Type 2 diabetes mellitus with unspecified complications: Secondary | ICD-10-CM

## 2021-10-27 DIAGNOSIS — R69 Illness, unspecified: Secondary | ICD-10-CM | POA: Diagnosis not present

## 2021-10-27 DIAGNOSIS — Z79899 Other long term (current) drug therapy: Secondary | ICD-10-CM | POA: Diagnosis not present

## 2021-10-27 DIAGNOSIS — M1711 Unilateral primary osteoarthritis, right knee: Principal | ICD-10-CM | POA: Insufficient documentation

## 2021-10-27 DIAGNOSIS — Z23 Encounter for immunization: Secondary | ICD-10-CM | POA: Diagnosis not present

## 2021-10-27 DIAGNOSIS — E039 Hypothyroidism, unspecified: Secondary | ICD-10-CM | POA: Insufficient documentation

## 2021-10-27 DIAGNOSIS — Z96659 Presence of unspecified artificial knee joint: Secondary | ICD-10-CM

## 2021-10-27 HISTORY — PX: KNEE ARTHROPLASTY: SHX992

## 2021-10-27 LAB — GLUCOSE, CAPILLARY
Glucose-Capillary: 175 mg/dL — ABNORMAL HIGH (ref 70–99)
Glucose-Capillary: 130 mg/dL — ABNORMAL HIGH (ref 70–99)
Glucose-Capillary: 164 mg/dL — ABNORMAL HIGH (ref 70–99)

## 2021-10-27 LAB — ABO/RH: ABO/RH(D): O POS

## 2021-10-27 LAB — TYPE AND SCREEN
ABO/RH(D): O POS
Antibody Screen: NEGATIVE

## 2021-10-27 SURGERY — ARTHROPLASTY, KNEE, TOTAL, USING IMAGELESS COMPUTER-ASSISTED NAVIGATION
Anesthesia: Spinal | Site: Knee | Laterality: Right

## 2021-10-27 MED ORDER — OXYCODONE HCL 5 MG PO TABS
5.0000 mg | ORAL_TABLET | ORAL | Status: DC | PRN
  Administered 2021-10-28: 5 mg via ORAL
  Filled 2021-10-27: qty 1

## 2021-10-27 MED ORDER — CEFAZOLIN SODIUM-DEXTROSE 2-4 GM/100ML-% IV SOLN
INTRAVENOUS | Status: AC
  Administered 2021-10-27: 2 g via INTRAVENOUS
  Filled 2021-10-27: qty 100

## 2021-10-27 MED ORDER — DEXAMETHASONE SODIUM PHOSPHATE 10 MG/ML IJ SOLN: 8.0000 mg | Freq: Once | INTRAMUSCULAR | Status: AC

## 2021-10-27 MED ORDER — TRANEXAMIC ACID-NACL 1000-0.7 MG/100ML-% IV SOLN
INTRAVENOUS | Status: AC
  Filled 2021-10-27: qty 100

## 2021-10-27 MED ORDER — ALUM & MAG HYDROXIDE-SIMETH 200-200-20 MG/5ML PO SUSP: 30.0000 mL | ORAL | Status: DC | PRN

## 2021-10-27 MED ORDER — FENTANYL CITRATE (PF) 100 MCG/2ML IJ SOLN
25.0000 ug | INTRAMUSCULAR | Status: DC | PRN
  Administered 2021-10-27: 25 ug via INTRAVENOUS

## 2021-10-27 MED ORDER — OXYCODONE HCL 5 MG PO TABS: 5.0000 mg | ORAL_TABLET | Freq: Once | ORAL | Status: DC | PRN

## 2021-10-27 MED ORDER — LACTATED RINGERS IV SOLN: INTRAVENOUS | Status: DC

## 2021-10-27 MED ORDER — CELECOXIB 200 MG PO CAPS
ORAL_CAPSULE | ORAL | Status: AC
  Administered 2021-10-27: 400 mg via ORAL
  Filled 2021-10-27: qty 2

## 2021-10-27 MED ORDER — CELECOXIB 200 MG PO CAPS
200.0000 mg | ORAL_CAPSULE | Freq: Two times a day (BID) | ORAL | Status: DC
  Administered 2021-10-27 – 2021-10-28 (×3): 200 mg via ORAL
  Filled 2021-10-27 (×3): qty 1

## 2021-10-27 MED ORDER — DIPHENHYDRAMINE HCL 12.5 MG/5ML PO ELIX: 12.5000 mg | ORAL_SOLUTION | ORAL | Status: DC | PRN

## 2021-10-27 MED ORDER — GABAPENTIN 300 MG PO CAPS
ORAL_CAPSULE | ORAL | Status: AC
  Administered 2021-10-27: 300 mg via ORAL
  Filled 2021-10-27: qty 1

## 2021-10-27 MED ORDER — CELECOXIB 200 MG PO CAPS: 400.0000 mg | ORAL_CAPSULE | Freq: Once | ORAL | Status: AC

## 2021-10-27 MED ORDER — PROPOFOL 10 MG/ML IV BOLUS
INTRAVENOUS | Status: DC | PRN
  Administered 2021-10-27: 20 mg via INTRAVENOUS

## 2021-10-27 MED ORDER — BUPIVACAINE LIPOSOME 1.3 % IJ SUSP
INTRAMUSCULAR | Status: AC
  Filled 2021-10-27: qty 20

## 2021-10-27 MED ORDER — TRANEXAMIC ACID-NACL 1000-0.7 MG/100ML-% IV SOLN
INTRAVENOUS | Status: AC
  Administered 2021-10-27: 1000 mg via INTRAVENOUS
  Filled 2021-10-27: qty 100

## 2021-10-27 MED ORDER — MIDAZOLAM HCL 5 MG/5ML IJ SOLN
INTRAMUSCULAR | Status: DC | PRN
  Administered 2021-10-27: .5 mg via INTRAVENOUS
  Administered 2021-10-27: 1.5 mg via INTRAVENOUS

## 2021-10-27 MED ORDER — FENTANYL CITRATE (PF) 100 MCG/2ML IJ SOLN
INTRAMUSCULAR | Status: DC | PRN
  Administered 2021-10-27 (×2): 50 ug via INTRAVENOUS

## 2021-10-27 MED ORDER — LEVOTHYROXINE SODIUM 50 MCG PO TABS
125.0000 ug | ORAL_TABLET | Freq: Every day | ORAL | Status: DC
  Administered 2021-10-28: 125 ug via ORAL
  Filled 2021-10-27: qty 1

## 2021-10-27 MED ORDER — TRANEXAMIC ACID-NACL 1000-0.7 MG/100ML-% IV SOLN: 1000.0000 mg | Freq: Once | INTRAVENOUS | Status: AC

## 2021-10-27 MED ORDER — CEFAZOLIN SODIUM-DEXTROSE 2-4 GM/100ML-% IV SOLN
2.0000 g | Freq: Four times a day (QID) | INTRAVENOUS | Status: AC
  Administered 2021-10-27: 2 g via INTRAVENOUS
  Filled 2021-10-27 (×2): qty 100

## 2021-10-27 MED ORDER — TRAMADOL HCL 50 MG PO TABS
50.0000 mg | ORAL_TABLET | ORAL | Status: DC | PRN
  Administered 2021-10-27 (×2): 100 mg via ORAL
  Filled 2021-10-27 (×2): qty 2

## 2021-10-27 MED ORDER — VANCOMYCIN HCL IN DEXTROSE 1-5 GM/200ML-% IV SOLN
INTRAVENOUS | Status: AC
  Administered 2021-10-27: 1000 mg via INTRAVENOUS
  Filled 2021-10-27: qty 200

## 2021-10-27 MED ORDER — METOPROLOL TARTRATE 50 MG PO TABS
50.0000 mg | ORAL_TABLET | Freq: Every day | ORAL | Status: DC
  Administered 2021-10-28: 50 mg via ORAL
  Filled 2021-10-27 (×2): qty 1

## 2021-10-27 MED ORDER — ACETAMINOPHEN 10 MG/ML IV SOLN
INTRAVENOUS | Status: AC
  Filled 2021-10-27: qty 100

## 2021-10-27 MED ORDER — SENNOSIDES-DOCUSATE SODIUM 8.6-50 MG PO TABS
1.0000 | ORAL_TABLET | Freq: Two times a day (BID) | ORAL | Status: DC
  Administered 2021-10-27 – 2021-10-28 (×3): 1 via ORAL
  Filled 2021-10-27 (×3): qty 1

## 2021-10-27 MED ORDER — PROPOFOL 500 MG/50ML IV EMUL
INTRAVENOUS | Status: DC | PRN
  Administered 2021-10-27: 50 ug/kg/min via INTRAVENOUS

## 2021-10-27 MED ORDER — FAMOTIDINE 20 MG PO TABS: 20.0000 mg | ORAL_TABLET | Freq: Once | ORAL | Status: AC

## 2021-10-27 MED ORDER — ONDANSETRON HCL 4 MG/2ML IJ SOLN
INTRAMUSCULAR | Status: AC
  Administered 2021-10-27: 4 mg via INTRAVENOUS
  Filled 2021-10-27: qty 2

## 2021-10-27 MED ORDER — SODIUM CHLORIDE FLUSH 0.9 % IV SOLN
INTRAVENOUS | Status: AC
  Filled 2021-10-27: qty 40

## 2021-10-27 MED ORDER — METFORMIN HCL 500 MG PO TABS: 500.0000 mg | ORAL_TABLET | Freq: Every day | ORAL | Status: DC

## 2021-10-27 MED ORDER — SODIUM CHLORIDE (PF) 0.9 % IJ SOLN
INTRAMUSCULAR | Status: DC | PRN
  Administered 2021-10-27: 120 mL via INTRAMUSCULAR

## 2021-10-27 MED ORDER — OXYCODONE HCL 5 MG PO TABS
10.0000 mg | ORAL_TABLET | ORAL | Status: DC | PRN
  Administered 2021-10-27 – 2021-10-28 (×3): 10 mg via ORAL
  Filled 2021-10-27 (×3): qty 2

## 2021-10-27 MED ORDER — FLEET ENEMA 7-19 GM/118ML RE ENEM: 1.0000 | ENEMA | Freq: Once | RECTAL | Status: DC | PRN

## 2021-10-27 MED ORDER — PROPOFOL 1000 MG/100ML IV EMUL
INTRAVENOUS | Status: AC
  Filled 2021-10-27: qty 100

## 2021-10-27 MED ORDER — ENOXAPARIN SODIUM 30 MG/0.3ML IJ SOSY
30.0000 mg | PREFILLED_SYRINGE | Freq: Two times a day (BID) | INTRAMUSCULAR | Status: DC
  Administered 2021-10-28: 30 mg via SUBCUTANEOUS
  Filled 2021-10-27: qty 0.3

## 2021-10-27 MED ORDER — INFLUENZA VAC SPLIT QUAD 0.5 ML IM SUSY
0.5000 mL | PREFILLED_SYRINGE | INTRAMUSCULAR | Status: AC
  Administered 2021-10-28: 0.5 mL via INTRAMUSCULAR
  Filled 2021-10-27: qty 0.5

## 2021-10-27 MED ORDER — GLYCOPYRROLATE 0.2 MG/ML IJ SOLN
INTRAMUSCULAR | Status: AC
  Filled 2021-10-27: qty 1

## 2021-10-27 MED ORDER — ONDANSETRON HCL 4 MG PO TABS: 4.0000 mg | ORAL_TABLET | Freq: Four times a day (QID) | ORAL | Status: DC | PRN

## 2021-10-27 MED ORDER — ACETAMINOPHEN 325 MG PO TABS: 325.0000 mg | ORAL_TABLET | Freq: Four times a day (QID) | ORAL | Status: DC | PRN

## 2021-10-27 MED ORDER — INSULIN ASPART 100 UNIT/ML IJ SOLN
0.0000 [IU] | Freq: Three times a day (TID) | INTRAMUSCULAR | Status: DC
  Administered 2021-10-28 (×2): 2 [IU] via SUBCUTANEOUS
  Filled 2021-10-27 (×3): qty 1

## 2021-10-27 MED ORDER — TRANEXAMIC ACID-NACL 1000-0.7 MG/100ML-% IV SOLN
INTRAVENOUS | Status: DC | PRN
  Administered 2021-10-27: 1000 mg via INTRAVENOUS

## 2021-10-27 MED ORDER — ONDANSETRON HCL 4 MG/2ML IJ SOLN: 4.0000 mg | Freq: Once | INTRAMUSCULAR | Status: AC | PRN

## 2021-10-27 MED ORDER — FAMOTIDINE 20 MG PO TABS
ORAL_TABLET | ORAL | Status: AC
  Administered 2021-10-27: 20 mg via ORAL
  Filled 2021-10-27: qty 1

## 2021-10-27 MED ORDER — INSULIN ASPART 100 UNIT/ML IJ SOLN: 0.0000 [IU] | Freq: Every day | INTRAMUSCULAR | Status: DC

## 2021-10-27 MED ORDER — VANCOMYCIN HCL IN DEXTROSE 1-5 GM/200ML-% IV SOLN: 1000.0000 mg | Freq: Once | INTRAVENOUS | Status: AC

## 2021-10-27 MED ORDER — CHLORHEXIDINE GLUCONATE 0.12 % MT SOLN
OROMUCOSAL | Status: AC
  Administered 2021-10-27: 15 mL via OROMUCOSAL
  Filled 2021-10-27: qty 15

## 2021-10-27 MED ORDER — PHENYLEPHRINE HCL-NACL 20-0.9 MG/250ML-% IV SOLN
INTRAVENOUS | Status: DC | PRN
  Administered 2021-10-27: 15 ug/min via INTRAVENOUS

## 2021-10-27 MED ORDER — CITALOPRAM HYDROBROMIDE 10 MG PO TABS
20.0000 mg | ORAL_TABLET | Freq: Every day | ORAL | Status: DC
  Administered 2021-10-27 – 2021-10-28 (×2): 20 mg via ORAL
  Filled 2021-10-27 (×2): qty 2

## 2021-10-27 MED ORDER — METFORMIN HCL 500 MG PO TABS
500.0000 mg | ORAL_TABLET | Freq: Every day | ORAL | Status: DC
  Administered 2021-10-28: 500 mg via ORAL
  Filled 2021-10-27: qty 1

## 2021-10-27 MED ORDER — MIDAZOLAM HCL 2 MG/2ML IJ SOLN
INTRAMUSCULAR | Status: AC
  Filled 2021-10-27: qty 2

## 2021-10-27 MED ORDER — CHLORHEXIDINE GLUCONATE 0.12 % MT SOLN: 15.0000 mL | Freq: Once | OROMUCOSAL | Status: AC

## 2021-10-27 MED ORDER — PHENYLEPHRINE HCL-NACL 20-0.9 MG/250ML-% IV SOLN
INTRAVENOUS | Status: AC
  Filled 2021-10-27: qty 250

## 2021-10-27 MED ORDER — PROPOFOL 10 MG/ML IV BOLUS
INTRAVENOUS | Status: AC
  Filled 2021-10-27: qty 20

## 2021-10-27 MED ORDER — GLYCOPYRROLATE 0.2 MG/ML IJ SOLN
INTRAMUSCULAR | Status: DC | PRN
  Administered 2021-10-27: .15 mg via INTRAVENOUS
  Administered 2021-10-27: .2 mg via INTRAVENOUS

## 2021-10-27 MED ORDER — PANTOPRAZOLE SODIUM 40 MG PO TBEC
40.0000 mg | DELAYED_RELEASE_TABLET | Freq: Two times a day (BID) | ORAL | Status: DC
  Administered 2021-10-27 – 2021-10-28 (×3): 40 mg via ORAL
  Filled 2021-10-27 (×3): qty 1

## 2021-10-27 MED ORDER — OXYCODONE HCL 5 MG/5ML PO SOLN: 5.0000 mg | Freq: Once | ORAL | Status: DC | PRN

## 2021-10-27 MED ORDER — BUPIVACAINE HCL (PF) 0.5 % IJ SOLN
INTRAMUSCULAR | Status: AC
  Filled 2021-10-27: qty 10

## 2021-10-27 MED ORDER — MAGNESIUM HYDROXIDE 400 MG/5ML PO SUSP
30.0000 mL | Freq: Every day | ORAL | Status: DC
  Administered 2021-10-27 – 2021-10-28 (×2): 30 mL via ORAL
  Filled 2021-10-27 (×2): qty 30

## 2021-10-27 MED ORDER — METOCLOPRAMIDE HCL 5 MG PO TABS
10.0000 mg | ORAL_TABLET | Freq: Three times a day (TID) | ORAL | Status: DC
  Administered 2021-10-27 – 2021-10-28 (×4): 10 mg via ORAL
  Filled 2021-10-27 (×4): qty 2

## 2021-10-27 MED ORDER — BISACODYL 10 MG RE SUPP: 10.0000 mg | Freq: Every day | RECTAL | Status: DC | PRN

## 2021-10-27 MED ORDER — CHLORHEXIDINE GLUCONATE 4 % EX LIQD
60.0000 mL | Freq: Once | CUTANEOUS | Status: AC
  Administered 2021-10-27: 4 via TOPICAL

## 2021-10-27 MED ORDER — SURGIPHOR WOUND IRRIGATION SYSTEM - OPTIME: TOPICAL | Status: DC | PRN

## 2021-10-27 MED ORDER — ONDANSETRON HCL 4 MG/2ML IJ SOLN: 4.0000 mg | Freq: Four times a day (QID) | INTRAMUSCULAR | Status: DC | PRN

## 2021-10-27 MED ORDER — MENTHOL 3 MG MT LOZG: 1.0000 | LOZENGE | OROMUCOSAL | Status: DC | PRN

## 2021-10-27 MED ORDER — ORAL CARE MOUTH RINSE: 15.0000 mL | Freq: Once | OROMUCOSAL | Status: AC

## 2021-10-27 MED ORDER — CEFAZOLIN SODIUM-DEXTROSE 2-4 GM/100ML-% IV SOLN
2.0000 g | INTRAVENOUS | Status: AC
  Administered 2021-10-27: 2 g via INTRAVENOUS

## 2021-10-27 MED ORDER — ACETAMINOPHEN 10 MG/ML IV SOLN
INTRAVENOUS | Status: DC | PRN
  Administered 2021-10-27: 1000 mg via INTRAVENOUS

## 2021-10-27 MED ORDER — OXYCODONE HCL 5 MG PO TABS
ORAL_TABLET | ORAL | Status: AC
  Administered 2021-10-27: 10 mg via ORAL
  Filled 2021-10-27: qty 2

## 2021-10-27 MED ORDER — TRANEXAMIC ACID-NACL 1000-0.7 MG/100ML-% IV SOLN: 1000.0000 mg | INTRAVENOUS | Status: DC

## 2021-10-27 MED ORDER — FENTANYL CITRATE (PF) 100 MCG/2ML IJ SOLN
INTRAMUSCULAR | Status: AC
  Filled 2021-10-27: qty 2

## 2021-10-27 MED ORDER — FENTANYL CITRATE (PF) 100 MCG/2ML IJ SOLN
INTRAMUSCULAR | Status: AC
  Administered 2021-10-27: 25 ug via INTRAVENOUS
  Filled 2021-10-27: qty 2

## 2021-10-27 MED ORDER — GABAPENTIN 300 MG PO CAPS: 300.0000 mg | ORAL_CAPSULE | Freq: Once | ORAL | Status: AC

## 2021-10-27 MED ORDER — SODIUM CHLORIDE 0.9 % IV SOLN: INTRAVENOUS | Status: DC

## 2021-10-27 MED ORDER — BUPIVACAINE HCL (PF) 0.5 % IJ SOLN
INTRAMUSCULAR | Status: DC | PRN
  Administered 2021-10-27: 3 mL

## 2021-10-27 MED ORDER — PHENOL 1.4 % MT LIQD: 1.0000 | OROMUCOSAL | Status: DC | PRN

## 2021-10-27 MED ORDER — ACETAMINOPHEN 10 MG/ML IV SOLN
1000.0000 mg | Freq: Four times a day (QID) | INTRAVENOUS | Status: DC
  Administered 2021-10-27 – 2021-10-28 (×3): 1000 mg via INTRAVENOUS
  Filled 2021-10-27 (×3): qty 100

## 2021-10-27 MED ORDER — BUPIVACAINE HCL (PF) 0.25 % IJ SOLN
INTRAMUSCULAR | Status: AC
  Filled 2021-10-27: qty 60

## 2021-10-27 MED ORDER — ACETAMINOPHEN 10 MG/ML IV SOLN: 1000.0000 mg | Freq: Once | INTRAVENOUS | Status: DC | PRN

## 2021-10-27 MED ORDER — SODIUM CHLORIDE 0.9 % IR SOLN
Status: DC | PRN
  Administered 2021-10-27: 3000 mL

## 2021-10-27 MED ORDER — DEXAMETHASONE SODIUM PHOSPHATE 10 MG/ML IJ SOLN
INTRAMUSCULAR | Status: AC
  Administered 2021-10-27: 8 mg via INTRAVENOUS
  Filled 2021-10-27: qty 1

## 2021-10-27 MED ORDER — HYDROMORPHONE HCL 1 MG/ML IJ SOLN: 0.5000 mg | INTRAMUSCULAR | Status: DC | PRN

## 2021-10-27 MED ORDER — FERROUS SULFATE 325 (65 FE) MG PO TABS
325.0000 mg | ORAL_TABLET | Freq: Two times a day (BID) | ORAL | Status: DC
  Administered 2021-10-27 – 2021-10-28 (×2): 325 mg via ORAL
  Filled 2021-10-27 (×2): qty 1

## 2021-10-27 SURGICAL SUPPLY — 77 items
ATTUNE MED DOME PAT 38 KNEE (Knees) IMPLANT
ATTUNE PS FEM RT SZ 4 CEM KNEE (Femur) IMPLANT
ATTUNE PSRP INSR SZ4 7 KNEE (Insert) IMPLANT
BASE TIBIAL ROT PLAT SZ 5 KNEE (Knees) IMPLANT
BATTERY INSTRU NAVIGATION (MISCELLANEOUS) ×4 IMPLANT
BLADE CLIPPER SURG (BLADE) IMPLANT
BLADE SAW 70X12.5 (BLADE) ×1 IMPLANT
BLADE SAW 90X13X1.19 OSCILLAT (BLADE) ×1 IMPLANT
BLADE SAW 90X25X1.19 OSCILLAT (BLADE) ×1 IMPLANT
BONE CEMENT GENTAMICIN (Cement) ×2 IMPLANT
BSPLAT TIB 5 CMNT ROT PLAT STR (Knees) ×1 IMPLANT
BTRY SRG DRVR LF (MISCELLANEOUS) ×4
CEMENT BONE GENTAMICIN 40 (Cement) IMPLANT
COOLER POLAR GLACIER W/PUMP (MISCELLANEOUS) ×1 IMPLANT
CUFF TOURN SGL QUICK 24 (TOURNIQUET CUFF)
CUFF TOURN SGL QUICK 34 (TOURNIQUET CUFF)
CUFF TRNQT CYL 24X4X16.5-23 (TOURNIQUET CUFF) IMPLANT
CUFF TRNQT CYL 34X4.125X (TOURNIQUET CUFF) IMPLANT
DRAPE 3/4 80X56 (DRAPES) ×1 IMPLANT
DRAPE INCISE IOBAN 66X45 STRL (DRAPES) IMPLANT
DRSG DERMACEA NONADH 3X8 (GAUZE/BANDAGES/DRESSINGS) ×1 IMPLANT
DRSG MEPILEX SACRM 8.7X9.8 (GAUZE/BANDAGES/DRESSINGS) ×1 IMPLANT
DRSG OPSITE POSTOP 4X14 (GAUZE/BANDAGES/DRESSINGS) ×1 IMPLANT
DRSG TEGADERM 4X4.75 (GAUZE/BANDAGES/DRESSINGS) ×1 IMPLANT
DURAPREP 26ML APPLICATOR (WOUND CARE) ×2 IMPLANT
ELECT CAUTERY BLADE 6.4 (BLADE) ×1 IMPLANT
ELECT REM PT RETURN 9FT ADLT (ELECTROSURGICAL) ×1
ELECTRODE REM PT RTRN 9FT ADLT (ELECTROSURGICAL) ×1 IMPLANT
EX-PIN ORTHOLOCK NAV 4X150 (PIN) ×2 IMPLANT
GLOVE BIO SURGEON STRL SZ7 (GLOVE) ×2 IMPLANT
GLOVE BIOGEL M STRL SZ7.5 (GLOVE) ×2 IMPLANT
GLOVE BIOGEL PI IND STRL 8 (GLOVE) ×1 IMPLANT
GLOVE PI ORTHO PRO STRL 7.5 (GLOVE) ×2 IMPLANT
GLOVE SURG UNDER POLY LF SZ7.5 (GLOVE) ×1 IMPLANT
GOWN STRL REUS W/ TWL LRG LVL3 (GOWN DISPOSABLE) ×2 IMPLANT
GOWN STRL REUS W/ TWL XL LVL3 (GOWN DISPOSABLE) ×1 IMPLANT
GOWN STRL REUS W/TWL LRG LVL3 (GOWN DISPOSABLE) ×2
GOWN STRL REUS W/TWL XL LVL3 (GOWN DISPOSABLE) ×1
HEMOVAC 400CC 10FR (MISCELLANEOUS) ×1 IMPLANT
HOLDER FOLEY CATH W/STRAP (MISCELLANEOUS) ×1 IMPLANT
HOLSTER ELECTROSUGICAL PENCIL (MISCELLANEOUS) ×1 IMPLANT
HOOD PEEL AWAY FLYTE STAYCOOL (MISCELLANEOUS) ×2 IMPLANT
IV NS IRRIG 3000ML ARTHROMATIC (IV SOLUTION) ×1 IMPLANT
KIT TURNOVER KIT A (KITS) ×1 IMPLANT
KNIFE SCULPS 14X20 (INSTRUMENTS) ×1 IMPLANT
MANIFOLD NEPTUNE II (INSTRUMENTS) ×2 IMPLANT
NDL SPNL 20GX3.5 QUINCKE YW (NEEDLE) ×2 IMPLANT
NEEDLE SPNL 20GX3.5 QUINCKE YW (NEEDLE) ×2 IMPLANT
NS IRRIG 500ML POUR BTL (IV SOLUTION) ×1 IMPLANT
PACK TOTAL KNEE (MISCELLANEOUS) ×1 IMPLANT
PAD ABD DERMACEA PRESS 5X9 (GAUZE/BANDAGES/DRESSINGS) ×2 IMPLANT
PAD WRAPON POLAR KNEE (MISCELLANEOUS) ×1 IMPLANT
PIN DRILL FIX HALF THREAD (BIT) ×2 IMPLANT
PIN FIXATION 1/8DIA X 3INL (PIN) ×1 IMPLANT
PULSAVAC PLUS IRRIG FAN TIP (DISPOSABLE) ×1
SOL PREP PVP 2OZ (MISCELLANEOUS) ×1
SOLUTION IRRIG SURGIPHOR (IV SOLUTION) ×1 IMPLANT
SOLUTION PREP PVP 2OZ (MISCELLANEOUS) ×1 IMPLANT
SPONGE DRAIN TRACH 4X4 STRL 2S (GAUZE/BANDAGES/DRESSINGS) ×1 IMPLANT
STAPLER SKIN PROX 35W (STAPLE) ×1 IMPLANT
STOCKINETTE IMPERV 14X48 (MISCELLANEOUS) IMPLANT
STRAP TIBIA SHORT (MISCELLANEOUS) ×1 IMPLANT
SUCTION FRAZIER HANDLE 10FR (MISCELLANEOUS) ×1
SUCTION TUBE FRAZIER 10FR DISP (MISCELLANEOUS) ×1 IMPLANT
SUT VIC AB 0 CT1 36 (SUTURE) ×2 IMPLANT
SUT VIC AB 1 CT1 36 (SUTURE) ×2 IMPLANT
SUT VIC AB 2-0 CT2 27 (SUTURE) ×1 IMPLANT
SYR 30ML LL (SYRINGE) ×2 IMPLANT
TIBIAL BASE ROT PLAT SZ 5 KNEE (Knees) ×1 IMPLANT
TIP FAN IRRIG PULSAVAC PLUS (DISPOSABLE) ×1 IMPLANT
TOWEL OR 17X26 4PK STRL BLUE (TOWEL DISPOSABLE) IMPLANT
TOWER CARTRIDGE SMART MIX (DISPOSABLE) ×1 IMPLANT
TRAP FLUID SMOKE EVACUATOR (MISCELLANEOUS) ×1 IMPLANT
TRAY FOLEY MTR SLVR 16FR STAT (SET/KITS/TRAYS/PACK) ×1 IMPLANT
WATER STERILE IRR 1000ML POUR (IV SOLUTION) IMPLANT
WATER STERILE IRR 500ML POUR (IV SOLUTION) ×1 IMPLANT
WRAPON POLAR PAD KNEE (MISCELLANEOUS) ×1

## 2021-10-27 NOTE — Evaluation (Signed)
Physical Therapy Evaluation Patient Details Name: Carrie Pham MRN: [id number] DOB: 1960/02/13 Today's Date: 10/27/2021  History of Present Illness  61 y/o female s/p R TKA 10/27/21.  Clinical Impression  Pt with some pain but eager to work with PT.  She showed good quad strength, SLRs, etc and was able to do most exercises against some resistance.  Pt able to attain >70 degrees AROM in R knee and after a few ROM reps to ~85 with gentle overpressure.  She did c/o ~8/10 t/o most of the session and had some WBing hesitancy during ambulation but ultimately walked ~65 ft with steadily improving cadence and confidence.  Pt has various home entry options with and w/o rails, likely will use single rail with 5 steps.  Pt doing well POD0, pleasant and motivated t/o the eval and subsequent gait and exercise training.      Recommendations for follow up therapy are one component of a multi-disciplinary discharge planning process, led by the attending physician.  Recommendations may be updated based on patient status, additional functional criteria and insurance authorization.  Follow Up Recommendations Follow physician's recommendations for discharge plan and follow up therapies      Assistance Recommended at Discharge Intermittent Supervision/Assistance  Patient can return home with the following  Assistance with cooking/housework;Assist for transportation;Help with stairs or ramp for entrance    Equipment Recommendations BSC/3in1  Recommendations for Other Services       Functional Status Assessment Patient has had a recent decline in their functional status and demonstrates the ability to make significant improvements in function in a reasonable and predictable amount of time.     Precautions / Restrictions Precautions Precautions: Fall;Knee Precaution Booklet Issued: Yes (comment) Precaution Comments: HEP Restrictions Weight Bearing Restrictions: Yes RLE Weight Bearing: Weight bearing as  tolerated      Mobility  Bed Mobility Overal bed mobility: Independent                  Transfers Overall transfer level: Needs assistance Equipment used: Rolling walker (2 wheels) Transfers: Sit to/from Stand Sit to Stand: Min guard           General transfer comment: Pt needed repeated cuing for set up and sequencing, did need to leverage back of legs on bed to rise w/o phyiscal assist    Ambulation/Gait Ambulation/Gait assistance: Min guard Gait Distance (Feet): 65 Feet Assistive device: Rolling walker (2 wheels)         General Gait Details: Pt with some initial hesitancy with WBing with decreased speed and increased UE reliance.  With cuing she was able to improve cadence/step length but with rigid knee posturing/flat foot weight acceptance.  Pt with no LOBs, fatigue or increased pain with the effort.  Stairs            Wheelchair Mobility    Modified Rankin (Stroke Patients Only)       Balance Overall balance assessment: Needs assistance Sitting-balance support: No upper extremity supported Sitting balance-Leahy Scale: Normal     Standing balance support: Bilateral upper extremity supported Standing balance-Leahy Scale: Good Standing balance comment: safe, no LOBs, required increased UE use during R stance phase during ambulation                             Pertinent Vitals/Pain Pain Assessment Pain Assessment: 0-10 Pain Score: 8  Pain Location: R knee    Home Living Family/patient expects to be discharged  to:: Private residence Living Arrangements: Spouse/significant other Available Help at Discharge: Family;Available 24 hours/day   Home Access: Stairs to enter Entrance Stairs-Rails: Right Entrance Stairs-Number of Steps: 5     Home Equipment: Conservation officer, nature (2 wheels)      Prior Function Prior Level of Function : Independent/Modified Independent             Mobility Comments: Pt works, drives and is able to  stay active       Hand Dominance        Extremity/Trunk Assessment   Upper Extremity Assessment Upper Extremity Assessment: Overall WFL for tasks assessed    Lower Extremity Assessment Lower Extremity Assessment: Overall WFL for tasks assessed       Communication   Communication: No difficulties  Cognition Arousal/Alertness: Awake/alert Behavior During Therapy: WFL for tasks assessed/performed Overall Cognitive Status: Within Functional Limits for tasks assessed                                          General Comments      Exercises Total Joint Exercises Ankle Circles/Pumps: AROM, 10 reps Quad Sets: Strengthening, 10 reps Heel Slides: AROM, Strengthening, 10 reps (resisted leg ext) Hip ABduction/ADduction: Strengthening, 10 reps Straight Leg Raises: AROM, 10 reps Knee Flexion: PROM, 5 reps Goniometric ROM: 0-85   Assessment/Plan    PT Assessment Patient needs continued PT services  PT Problem List Decreased strength;Decreased range of motion;Decreased activity tolerance;Decreased balance;Decreased knowledge of use of DME;Decreased safety awareness;Decreased mobility;Pain       PT Treatment Interventions DME instruction;Gait training;Stair training;Functional mobility training;Therapeutic activities;Balance training;Therapeutic exercise;Neuromuscular re-education;Patient/family education    PT Goals (Current goals can be found in the Care Plan section)  Acute Rehab PT Goals Patient Stated Goal: go home tomorrow PT Goal Formulation: With patient Time For Goal Achievement: 11/09/21 Potential to Achieve Goals: Good    Frequency BID     Co-evaluation               AM-PAC PT "6 Clicks" Mobility  Outcome Measure Help needed turning from your back to your side while in a flat bed without using bedrails?: None Help needed moving from lying on your back to sitting on the side of a flat bed without using bedrails?: None Help needed moving  to and from a bed to a chair (including a wheelchair)?: A Little Help needed standing up from a chair using your arms (e.g., wheelchair or bedside chair)?: A Little Help needed to walk in hospital room?: A Little Help needed climbing 3-5 steps with a railing? : A Little 6 Click Score: 20    End of Session Equipment Utilized During Treatment: Gait belt Activity Tolerance: Patient tolerated treatment well Patient left: with chair alarm set;with call bell/phone within reach Nurse Communication: Mobility status PT Visit Diagnosis: Muscle weakness (generalized) (M62.81);Difficulty in walking, not elsewhere classified (R26.2);Pain Pain - Right/Left: Right Pain - part of body: Knee    Time: 5009-3818 PT Time Calculation (min) (ACUTE ONLY): 56 min   Charges:   PT Evaluation $PT Eval Low Complexity: 1 Low PT Treatments $Gait Training: 8-22 mins $Therapeutic Exercise: 8-22 mins        Kreg Shropshire, DPT 10/27/2021, 4:00 PM

## 2021-10-27 NOTE — Plan of Care (Signed)

## 2021-10-27 NOTE — Anesthesia Procedure Notes (Signed)
Spinal  Patient location during procedure: OR Start time: 10/27/2021 7:19 AM End time: 10/27/2021 7:25 AM Reason for block: surgical anesthesia Staffing Performed: resident/CRNA  Resident/CRNA: Demetrius Charity, CRNA Performed by: Demetrius Charity, CRNA Authorized by: Arita Miss, MD   Preanesthetic Checklist Completed: patient identified, IV checked, site marked, risks and benefits discussed, surgical consent, monitors and equipment checked, pre-op evaluation and timeout performed Spinal Block Patient position: sitting Prep: ChloraPrep Patient monitoring: heart rate, continuous pulse ox, blood pressure and cardiac monitor Approach: midline Location: L3-4 Injection technique: single-shot Needle Needle type: Introducer and Whitacre  Needle gauge: 25 G Needle length: 9 cm Assessment Sensory level: T10 Events: CSF return Additional Notes Sterile aseptic technique used throughout the procedure.  Negative paresthesia. Negative blood return. Positive free-flowing CSF. Expiration date of kit checked and confirmed. Patient tolerated procedure well, without complications.

## 2021-10-27 NOTE — Progress Notes (Signed)
Patient awake/alert x4. Able to move bil lower ext side to side, vague sensation.  Tolerating po fluids and crackers.  Asking to make sure "we" keep ahead of her knee pain.  Reviewed pain management with patient. Verbalizes understanding.

## 2021-10-27 NOTE — Anesthesia Preprocedure Evaluation (Signed)
Anesthesia Evaluation  Patient identified by MRN, date of birth, ID band Patient awake    Reviewed: Allergy & Precautions, NPO status , Patient's Chart, lab work & pertinent test results  History of Anesthesia Complications Negative for: history of anesthetic complications  Airway Mallampati: III  TM Distance: >3 FB Neck ROM: Full    Dental no notable dental hx. (+) Teeth Intact   Pulmonary neg pulmonary ROS, neg sleep apnea, neg COPD, Patient abstained from smoking.Not current smoker,    Pulmonary exam normal breath sounds clear to auscultation       Cardiovascular Exercise Tolerance: Good METShypertension, Pt. on medications (-) CAD and (-) Past MI (-) dysrhythmias  Rhythm:Regular Rate:Normal - Systolic murmurs    Neuro/Psych PSYCHIATRIC DISORDERS Depression negative neurological ROS     GI/Hepatic neg GERD  ,(+)     (-) substance abuse  ,   Endo/Other  diabetes, Well Controlled, Oral Hypoglycemic AgentsHypothyroidism   Renal/GU negative Renal ROS     Musculoskeletal  (+) Arthritis , Fibromyalgia -  Abdominal   Peds  Hematology   Anesthesia Other Findings Past Medical History: No date: Arthritis No date: Fibromyalgia No date: History of chicken pox No date: Hypertension No date: Hypothyroidism No date: Impaired fasting blood sugar 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: Nose colonized with MRSA     Comment:  a.) noted on presurgical PCR prior to RIGHT TKA No date: Pre-diabetes  Reproductive/Obstetrics                             Anesthesia Physical Anesthesia Plan  ASA: 2  Anesthesia Plan: Spinal   Post-op Pain Management: Ofirmev IV (intra-op)*, Gabapentin PO (pre-op)* and Celebrex PO (pre-op)*   Induction: Intravenous  PONV Risk Score and Plan: 2 and Ondansetron, Dexamethasone, Propofol infusion, TIVA and Midazolam  Airway Management Planned: Natural Airway  Additional Equipment:  None  Intra-op Plan:   Post-operative Plan:   Informed Consent: I have reviewed the patients History and Physical, chart, labs and discussed the procedure including the risks, benefits and alternatives for the proposed anesthesia with the patient or authorized representative who has indicated his/her understanding and acceptance.       Plan Discussed with: CRNA and Surgeon  Anesthesia Plan Comments: (Discussed R/B/A of neuraxial anesthesia technique with patient: - rare risks of spinal/epidural hematoma, nerve damage, infection - Risk of PDPH - Risk of nausea and vomiting - Risk of conversion to general anesthesia and its associated risks, including sore throat, damage to lips/eyes/teeth/oropharynx, and rare risks such as cardiac and respiratory events. - Risk of allergic reactions  Discussed the role of CRNA in patient's perioperative care.  Patient voiced understanding.)        Anesthesia Quick Evaluation

## 2021-10-27 NOTE — Interval H&P Note (Signed)
History and Physical Interval Note:  10/27/2021 6:01 AM  Carrie Pham  has presented today for surgery, with the diagnosis of PRIMARY OSTEOARTHRITIS OF RIGHT KNEE..  The various methods of treatment have been discussed with the patient and family. After consideration of risks, benefits and other options for treatment, the patient has consented to  Procedure(s): COMPUTER ASSISTED TOTAL KNEE ARTHROPLASTY (Right) as a surgical intervention.  The patient's history has been reviewed, patient examined, no change in status, stable for surgery.  I have reviewed the patient's chart and labs.  Questions were answered to the patient's satisfaction.     [place]

## 2021-10-27 NOTE — Transfer of Care (Signed)
Immediate Anesthesia Transfer of Care Note  Patient: Carrie Pham  Procedure(s) Performed: COMPUTER ASSISTED TOTAL KNEE ARTHROPLASTY (Right: Knee)  Patient Location: PACU  Anesthesia Type:Spinal  Level of Consciousness: awake, alert  and oriented  Airway & Oxygen Therapy: Patient Spontanous Breathing and Patient connected to nasal cannula oxygen  Post-op Assessment: Report given to RN and Post -op Vital signs reviewed and stable  Post vital signs: Reviewed and stable  Last Vitals:  Vitals Value Taken Time  BP 128/79 10/27/21 1045  Temp    Pulse 86 10/27/21 1049  Resp 21 10/27/21 1049  SpO2 99 % 10/27/21 1049  Vitals shown include unvalidated device data.  Last Pain:  Vitals:   10/27/21 0628  TempSrc: Temporal  PainSc: 0-No pain         Complications: No notable events documented.

## 2021-10-27 NOTE — Anesthesia Procedure Notes (Signed)
Date/Time: 10/27/2021 7:28 AM  Performed by: Demetrius Charity, CRNAPre-anesthesia Checklist: Patient identified, Emergency Drugs available, Suction available, Patient being monitored and Timeout performed Patient Re-evaluated:Patient Re-evaluated prior to induction Oxygen Delivery Method: Nasal cannula Placement Confirmation: CO2 detector and positive ETCO2

## 2021-10-27 NOTE — Op Note (Signed)
OPERATIVE NOTE  DATE OF SURGERY:  10/27/2021  PATIENT NAME:  DALAYA SUPPA   DOB: [birth year]  MRN: [id number]  PRE-OPERATIVE DIAGNOSIS: Degenerative arthrosis of the right knee, primary  POST-OPERATIVE DIAGNOSIS:  Same  PROCEDURE:  Right total knee arthroplasty using computer-assisted navigation  SURGEON:  Jena Gauss. M.D.  ASSISTANT: Baldwin Jamaica, PA-C (present and scrubbed throughout the case, critical for assistance with exposure, retraction, instrumentation, and closure)  ANESTHESIA: spinal  ESTIMATED BLOOD LOSS: 50 mL  FLUIDS REPLACED: 1100 mL of crystalloid  TOURNIQUET TIME: 84 minutes  DRAINS: 2 medium Hemovac drains  SOFT TISSUE RELEASES: Anterior cruciate ligament, posterior cruciate ligament, deep medial collateral ligament, patellofemoral ligament  IMPLANTS UTILIZED: DePuy Attune size 4 posterior stabilized femoral component (cemented), size 5 rotating platform tibial component (cemented), 38 mm medialized dome patella (cemented), and a 7 mm stabilized rotating platform polyethylene insert.  INDICATIONS FOR SURGERY: KELLA SPLINTER is a 61 y.o. year old female with a long history of progressive knee pain. X-rays demonstrated severe degenerative changes in tricompartmental fashion. The patient had not seen any significant improvement despite conservative nonsurgical intervention. After discussion of the risks and benefits of surgical intervention, the patient expressed understanding of the risks benefits and agree with plans for total knee arthroplasty.   The risks, benefits, and alternatives were discussed at length including but not limited to the risks of infection, bleeding, nerve injury, stiffness, blood clots, the need for revision surgery, cardiopulmonary complications, among others, and they were willing to proceed.  PROCEDURE IN DETAIL: The patient was brought into the operating room and, after adequate spinal anesthesia was achieved, a tourniquet was placed  on the patient's upper thigh. The patient's knee and leg were cleaned and prepped with alcohol and DuraPrep and draped in the usual sterile fashion. A "timeout" was performed as per usual protocol. The lower extremity was exsanguinated using an Esmarch, and the tourniquet was inflated to 300 mmHg. An anterior longitudinal incision was made followed by a standard mid vastus approach. The deep fibers of the medial collateral ligament were elevated in a subperiosteal fashion off of the medial flare of the tibia so as to maintain a continuous soft tissue sleeve. The patella was subluxed laterally and the patellofemoral ligament was incised. Inspection of the knee demonstrated severe degenerative changes with full-thickness loss of articular cartilage. Osteophytes were debrided using a rongeur. Anterior and posterior cruciate ligaments were excised. Two 4.0 mm Schanz pins were inserted in the femur and into the tibia for attachment of the array of trackers used for computer-assisted navigation. Hip center was identified using a circumduction technique. Distal landmarks were mapped using the computer. The distal femur and proximal tibia were mapped using the computer. The distal femoral cutting guide was positioned using computer-assisted navigation so as to achieve a 5 distal valgus cut. The femur was sized and it was felt that a size 4 femoral component was appropriate. A size 4 femoral cutting guide was positioned and the anterior cut was performed and verified using the computer. This was followed by completion of the posterior and chamfer cuts. Femoral cutting guide for the central box was then positioned in the center box cut was performed.  Attention was then directed to the proximal tibia. Medial and lateral menisci were excised. The extramedullary tibial cutting guide was positioned using computer-assisted navigation so as to achieve a 0 varus-valgus alignment and 3 posterior slope. The cut was performed and  verified using the computer. The proximal tibia was  sized and it was felt that a size 5 tibial tray was appropriate. Tibial and femoral trials were inserted followed by insertion of a 7 mm polyethylene insert. This allowed for excellent mediolateral soft tissue balancing both in flexion and in full extension. Finally, the patella was cut and prepared so as to accommodate a 38 mm medialized dome patella. A patella trial was placed and the knee was placed through a range of motion with excellent patellar tracking appreciated. The femoral trial was removed after debridement of posterior osteophytes. The central post-hole for the tibial component was reamed followed by insertion of a keel punch. Tibial trials were then removed. Cut surfaces of bone were irrigated with copious amounts of normal saline using pulsatile lavage and then suctioned dry. Polymethylmethacrylate cement with gentamicin was prepared in the usual fashion using a vacuum mixer. Cement was applied to the cut surface of the proximal tibia as well as along the undersurface of a size 5 rotating platform tibial component. Tibial component was positioned and impacted into place. Excess cement was removed using [employer] fast streamer. Cement was then applied to the cut surfaces of the femur as well as along the posterior flanges of the size 4 femoral component. The femoral component was positioned and impacted into place. Excess cement was removed using [employer] fast streamer. A 7 mm polyethylene trial was inserted and the knee was brought into full extension with steady axial compression applied. Finally, cement was applied to the backside of a 38 mm medialized dome patella and the patellar component was positioned and patellar clamp applied. Excess cement was removed using [employer] fast streamer. After adequate curing of the cement, the tourniquet was deflated after a total tourniquet time of 84 minutes. Hemostasis was achieved using electrocautery. The knee was irrigated with  copious amounts of normal saline using pulsatile lavage followed by 450 ml of Surgiphor and then suctioned dry. 20 mL of 1.3% Exparel and 60 mL of 0.25% Marcaine in 40 mL of normal saline was injected along the posterior capsule, medial and lateral gutters, and along the arthrotomy site. A 7 mm stabilized rotating platform polyethylene insert was inserted and the knee was placed through a range of motion with excellent mediolateral soft tissue balancing appreciated and excellent patellar tracking noted. 2 medium drains were placed in the wound bed and brought out through separate stab incisions. The medial parapatellar portion of the incision was reapproximated using interrupted sutures of #1 Vicryl. Subcutaneous tissue was approximated in layers using first #0 Vicryl followed #2-0 Vicryl. The skin was approximated with skin staples. A sterile dressing was applied.  The patient tolerated the procedure well and was transported to the recovery room in stable condition.    Markevious Ehmke P. Holley Bouche., M.D.

## 2021-10-28 ENCOUNTER — Encounter: Payer: Self-pay | Admitting: Orthopedic Surgery

## 2021-10-28 DIAGNOSIS — Z7984 Long term (current) use of oral hypoglycemic drugs: Secondary | ICD-10-CM | POA: Diagnosis not present

## 2021-10-28 DIAGNOSIS — M1711 Unilateral primary osteoarthritis, right knee: Secondary | ICD-10-CM | POA: Diagnosis not present

## 2021-10-28 DIAGNOSIS — Z23 Encounter for immunization: Secondary | ICD-10-CM | POA: Diagnosis not present

## 2021-10-28 DIAGNOSIS — Z79899 Other long term (current) drug therapy: Secondary | ICD-10-CM | POA: Diagnosis not present

## 2021-10-28 DIAGNOSIS — I10 Essential (primary) hypertension: Secondary | ICD-10-CM | POA: Diagnosis not present

## 2021-10-28 DIAGNOSIS — E039 Hypothyroidism, unspecified: Secondary | ICD-10-CM | POA: Diagnosis not present

## 2021-10-28 LAB — GLUCOSE, CAPILLARY
Glucose-Capillary: 132 mg/dL — ABNORMAL HIGH (ref 70–99)
Glucose-Capillary: 150 mg/dL — ABNORMAL HIGH (ref 70–99)

## 2021-10-28 MED ORDER — CELECOXIB 200 MG PO CAPS: 200.0000 mg | ORAL_CAPSULE | Freq: Two times a day (BID) | ORAL | 0 refills | Status: DC

## 2021-10-28 MED ORDER — TRAMADOL HCL 50 MG PO TABS: 50.0000 mg | ORAL_TABLET | ORAL | 0 refills | Status: DC | PRN

## 2021-10-28 MED ORDER — OXYCODONE HCL 5 MG PO TABS: 5.0000 mg | ORAL_TABLET | ORAL | 0 refills | Status: DC | PRN

## 2021-10-28 MED ORDER — ENOXAPARIN SODIUM 40 MG/0.4ML IJ SOSY: 40.0000 mg | PREFILLED_SYRINGE | INTRAMUSCULAR | 0 refills | Status: DC

## 2021-10-28 NOTE — Plan of Care (Signed)

## 2021-10-28 NOTE — Plan of Care (Signed)

## 2021-10-28 NOTE — Evaluation (Signed)
Occupational Therapy Evaluation Patient Details Name: Carrie Pham MRN: [id number] DOB: [birth year] Today's Date: 10/28/2021   History of Present Illness 61 y/o female s/p R TKA 10/27/21.   Clinical Impression   Pt seen for OT evaluation this date, POD#1 from above surgery. PTA pt is MOD I in ADL/IADL. Pt is eager to return to PLOF with less pain and improved safety and independence. Pt currently requires SET UP-supervision for LB dressing while in seated position due to pain and limited AROM of R knee. Pt provided education re: polar care mgt, falls prevention strategies, home/routines modifications, DME/AE for LB bathing and dressing tasks, and compression stocking mgt. Pt would benefit from skilled OT services including additional instruction in dressing techniques with or without assistive devices for dressing and bathing skills to support recall and carryover prior to discharge and ultimately to maximize safety, independence, and minimize falls risk and caregiver burden. Do not currently anticipate any OT needs following this hospitalization.   Pt is left in bedside chair, all needs met. OT will follow acutely.      Recommendations for follow up therapy are one component of a multi-disciplinary discharge planning process, led by the attending physician.  Recommendations may be updated based on patient status, additional functional criteria and insurance authorization.   Follow Up Recommendations  No OT follow up    Assistance Recommended at Discharge Intermittent Supervision/Assistance  Patient can return home with the following A little help with bathing/dressing/bathroom;Assistance with cooking/housework    Functional Status Assessment  Patient has had a recent decline in their functional status and demonstrates the ability to make significant improvements in function in a reasonable and predictable amount of time.  Equipment Recommendations  BSC/3in1    Recommendations for Other  Services       Precautions / Restrictions Precautions Precautions: Fall;Knee Restrictions Weight Bearing Restrictions: Yes RLE Weight Bearing: Weight bearing as tolerated      Mobility Bed Mobility Overal bed mobility: Modified Independent                  Transfers Overall transfer level: Needs assistance Equipment used: Rolling walker (2 wheels) Transfers: Sit to/from Stand Sit to Stand: Supervision                  Balance Overall balance assessment: Needs assistance Sitting-balance support: No upper extremity supported Sitting balance-Leahy Scale: Good     Standing balance support: Bilateral upper extremity supported Standing balance-Leahy Scale: Good                             ADL either performed or assessed with clinical judgement   ADL Overall ADL's : Needs assistance/impaired Eating/Feeding: Set up;Sitting   Grooming: Wash/dry hands;Wash/dry face;Standing;Supervision/safety Grooming Details (indicate cue type and reason): sink level with RW, frequent vcs for RW use         Upper Body Dressing : Set up;Sitting   Lower Body Dressing: Set up;Sit to/from stand;Supervision/safety   Toilet Transfer: Supervision/safety;Rolling walker (2 wheels);Ambulation Toilet Transfer Details (indicate cue type and reason): to regular toilet height with use of R sided grab bar Toileting- Clothing Manipulation and Hygiene: Supervision/safety;Sit to/from stand       Functional mobility during ADLs: Supervision/safety;Rolling walker (2 wheels) (approx 20' in room, intermittent vcs for RW use)       Vision Patient Visual Report: No change from baseline       Perception     Praxis  Pertinent Vitals/Pain Pain Assessment Pain Assessment: 0-10 Pain Score: 6  Pain Location: R knee Pain Descriptors / Indicators: Aching, Discomfort Pain Intervention(s): Limited activity within patient's tolerance, Monitored during session, Ice applied,  Premedicated before session     Hand Dominance     Extremity/Trunk Assessment Upper Extremity Assessment Upper Extremity Assessment: Overall WFL for tasks assessed   Lower Extremity Assessment Lower Extremity Assessment: RLE deficits/detail RLE Deficits / Details: s/p R TKA   Cervical / Trunk Assessment Cervical / Trunk Assessment: Normal   Communication Communication Communication: No difficulties   Cognition Arousal/Alertness: Awake/alert Behavior During Therapy: WFL for tasks assessed/performed Overall Cognitive Status: Within Functional Limits for tasks assessed                                       General Comments       Exercises     Shoulder Instructions      Home Living Family/patient expects to be discharged to:: Private residence Living Arrangements: Spouse/significant other Available Help at Discharge: Family;Available PRN/intermittently Type of Home: House Home Access: Stairs to enter [employer] of Steps: 5 Entrance Stairs-Rails: Right Home Layout: One level     Bathroom Shower/Tub: Occupational psychologist: Standard Bathroom Accessibility: Yes   Home Equipment: Conservation officer, nature (2 wheels)          Prior Functioning/Environment Prior Level of Function : Independent/Modified Independent                        OT Problem List: Decreased strength;Decreased activity tolerance;Decreased knowledge of use of DME or AE      OT Treatment/Interventions: Self-care/ADL training;DME and/or AE instruction;Cognitive remediation/compensation;Therapeutic exercise;Patient/family education    OT Goals(Current goals can be found in the care plan section) Acute Rehab OT Goals Patient Stated Goal: improve strength OT Goal Formulation: With patient Time For Goal Achievement: 11/11/21 Potential to Achieve Goals: Good ADL Goals Pt Will Perform Grooming: with modified independence;standing Pt Will Perform Lower Body  Dressing: with modified independence;sit to/from stand Pt Will Transfer to Toilet: with modified independence;ambulating Pt Will Perform Toileting - Clothing Manipulation and hygiene: with modified independence;sit to/from stand  OT Frequency: Min 2X/week    Co-evaluation              AM-PAC OT "6 Clicks" Daily Activity     Outcome Measure Help from another person eating meals?: None Help from another person taking care of personal grooming?: None Help from another person toileting, which includes using toliet, bedpan, or urinal?: None Help from another person bathing (including washing, rinsing, drying)?: A Little Help from another person to put on and taking off regular upper body clothing?: None Help from another person to put on and taking off regular lower body clothing?: A Little 6 Click Score: 22   End of Session Equipment Utilized During Treatment: Rolling walker (2 wheels) Nurse Communication: Mobility status  Activity Tolerance: Patient tolerated treatment well Patient left: in chair;with call bell/phone within reach;with chair alarm set  OT Visit Diagnosis: Unsteadiness on feet (R26.81)                Time: 2330-0762 OT Time Calculation (min): 17 min Charges:  OT General Charges $OT Visit: 1 Visit OT Evaluation $OT Eval Low Complexity: 1 Low  Shanon Payor, OTD OTR/L  10/28/21, 8:51 AM

## 2021-10-28 NOTE — Anesthesia Postprocedure Evaluation (Signed)
Anesthesia Post Note  Patient: Carrie Pham  Procedure(s) Performed: COMPUTER ASSISTED TOTAL KNEE ARTHROPLASTY (Right: Knee)  Patient location during evaluation: Nursing Unit Anesthesia Type: Spinal Level of consciousness: awake Pain management: pain level controlled Respiratory status: spontaneous breathing Postop Assessment: no headache Anesthetic complications: no   No notable events documented.   Last Vitals:  Vitals:   10/27/21 1542 10/28/21 0006  BP: 131/70 (!) 142/69  Pulse: 64 63  Resp: 17 18  Temp: 36.8 C 36.5 C  SpO2: 95% 96%    Last Pain:  Vitals:   10/28/21 0630  TempSrc:   PainSc: 2                  Lerry Liner

## 2021-10-28 NOTE — Discharge Summary (Signed)
Physician Discharge Summary  Patient ID: Carrie Pham MRN: [id number] DOB/AGE: 08/09/1960 61 y.o.  Admit date: 10/27/2021 Discharge date: 10/28/2021  Admission Diagnoses:  Total knee replacement status [Z96.659]  Surgeries:Procedure(s):  Right total knee arthroplasty using computer-assisted navigation   SURGEON:  Marciano Sequin. M.D.   ASSISTANT: Cassell Smiles, PA-C (present and scrubbed throughout the case, critical for assistance with exposure, retraction, instrumentation, and closure)   ANESTHESIA: spinal   ESTIMATED BLOOD LOSS: 50 mL   FLUIDS REPLACED: 1100 mL of crystalloid   TOURNIQUET TIME: 84 minutes   DRAINS: 2 medium Hemovac drains   SOFT TISSUE RELEASES: Anterior cruciate ligament, posterior cruciate ligament, deep medial collateral ligament, patellofemoral ligament   IMPLANTS UTILIZED: DePuy Attune size 4 posterior stabilized femoral component (cemented), size 5 rotating platform tibial component (cemented), 38 mm medialized dome patella (cemented), and a 7 mm stabilized rotating platform polyethylene insert.  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   Primary osteoarthritis of left knee 09/12/2021   Primary osteoarthritis of right knee 09/12/2021   Encounter for general adult medical examination with abnormal findings 05/12/2021   Chronic pain of right knee 05/12/2021   Overweight (BMI 25.0-29.9) 05/12/2021   Atypical squamous cells of undetermined significance on cytologic smear of cervix ([facility]-US) 01/13/2021   Right thigh pain 02/24/2020   Controlled diabetes mellitus type 2 with complications ([place]) [id number]   Restless leg syndrome 02/24/2020   Fibromyalgia    Hypertension    Hypothyroidism    Arthritis    Class 1 obesity due to excess calories with serious comorbidity and body mass index (BMI) of 31.0 to 31.9 in adult 12/09/2015   Prediabetes 05/25/2015   Anemia 05/21/2015    Insomnia 05/21/2015   Depression 03/23/2015    Past Medical History:  Diagnosis Date   Arthritis    Fibromyalgia    History of chicken pox    Hypertension    Hypothyroidism    Impaired fasting blood sugar    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 female who was admitted 10/27/2021 with a diagnosis of right knee osteoarthritis and went to the operating room on 10/27/2021 and underwent right total knee arthroplasty. The patient received perioperative antibiotics for prophylaxis (see below). The patient tolerated the procedure well and was transported to PACU in stable condition. After meeting PACU criteria, the patient was subsequently transferred to the Orthopaedics/Rehabilitation unit.   The patient received DVT prophylaxis in the form of early mobilization, Lovenox, TED hose, and SCDs . A sacral pad had been placed and heels were elevated off of the bed with rolled towels in order to protect skin integrity. Foley catheter was discontinued on postoperative day #0. Wound drains were discontinued on postoperative day #1. The surgical incision was healing well without signs of infection.  Physical therapy was initiated postoperatively for transfers, gait training, and strengthening. Occupational therapy was initiated for activities of daily living and evaluation for assisted devices. Rehabilitation goals were reviewed in detail with the patient. The patient made steady progress with physical therapy and physical therapy recommended discharge to Home.   The patient achieved the preliminary goals of this hospitalization and was felt to be medically and orthopaedically appropriate for discharge.  She was given perioperative antibiotics:  Anti-infectives (From admission, onward)    Start     Dose/Rate  Route Frequency Ordered Stop   10/27/21 1330  ceFAZolin (ANCEF) IVPB 2g/100 mL premix        2 g 200 mL/hr over 30 Minutes Intravenous Every 6 hours 10/27/21 1227 10/27/21 2019   10/27/21 0630  ceFAZolin (ANCEF) IVPB 2g/100 mL premix        2 g 200 mL/hr over 30 Minutes Intravenous On call to O.R. 10/27/21 0615 10/27/21 0800   10/27/21 0630  vancomycin (VANCOCIN) IVPB 1000 mg/200 mL premix        1,000 mg 200 mL/hr over 60 Minutes Intravenous  Once 10/27/21 0615 10/27/21 0753   10/27/21 0619  ceFAZolin (ANCEF) 2-4 GM/100ML-% IVPB       Note to Pharmacy: Trudie Reed S: cabinet override      10/27/21 0619 10/27/21 2019     .  Recent vital signs:  Vitals:   10/28/21 0006 10/28/21 0820  BP: (!) 142/69 (!) 142/71  Pulse: 63 67  Resp: 18 17  Temp: 97.7 F (36.5 C) 97.8 F (36.6 C)  SpO2: 96% 98%    Recent laboratory studies:  No results for input(s): "WBC", "HGB", "HCT", "PLT", "K", "CL", "CO2", "BUN", "CREATININE", "GLUCOSE", "CALCIUM", "LABPT", "INR" in the last 72 hours.  Diagnostic Studies: DG Knee Right Port  Result Date: 10/27/2021 CLINICAL DATA:  Postop computer-assisted total knee arthroplasty. EXAM: PORTABLE RIGHT KNEE - 1-2 VIEW COMPARISON:  Right knee radiographs 05/12/2021 FINDINGS: Interval total right knee arthroplasty. No perihardware lucency is seen to indicate hardware failure or loosening. Expected postoperative changes including small joint effusion and intra-articular and subcutaneous air. There are anterior surgical skin staples. Superior approach patellofemoral drain. No acute fracture or dislocation. IMPRESSION: Interval total right knee arthroplasty without evidence of hardware failure. Electronically Signed   By: Yvonne Kendall M.D.   On: 10/27/2021 10:57    Discharge Medications:   Allergies as of 10/28/2021       Reactions   Codeine Itching        Medication List     STOP taking these medications    naproxen sodium 220 MG tablet Commonly known  as: ALEVE       TAKE these medications    acetaminophen 500 MG tablet Commonly known as: TYLENOL Take 1,000 mg by mouth every 6 (six) hours as needed.   celecoxib 200 MG capsule Commonly known as: CELEBREX Take 1 capsule (200 mg total) by mouth daily. What changed: Another medication with the same name was added. Make sure you understand how and when to take each.   celecoxib 200 MG capsule Commonly known as: CELEBREX Take 1 capsule (200 mg total) by mouth 2 (two) times daily. What changed: You were already taking a medication with the same name, and this prescription was added. Make sure you understand how and when to take each.   citalopram 20 MG tablet Commonly known as: CELEXA Take 1 tablet (20 mg total) by mouth daily.   enoxaparin 40 MG/0.4ML injection Commonly known as: LOVENOX Inject 0.4 mLs (40 mg total) into the skin daily for 14 days.   fluconazole 100 MG tablet Commonly known as: DIFLUCAN Take 100 mg by mouth daily as needed. As needed for yeast infection   levothyroxine 125 MCG tablet Commonly known as: Euthyrox Take 1 tablet (125 mcg total) by mouth daily before breakfast.   metFORMIN 500 MG tablet Commonly known as: GLUCOPHAGE Take  500 mg by mouth daily with breakfast.   metFORMIN 1000 MG tablet Commonly known as: GLUCOPHAGE Take 0.5 tablets (500 mg total) by mouth daily with breakfast.   metoprolol tartrate 50 MG tablet Commonly known as: LOPRESSOR Take 1 tablet (50 mg total) by mouth 2 (two) times daily. What changed: when to take this   oxyCODONE 5 MG immediate release tablet Commonly known as: Oxy IR/ROXICODONE Take 1 tablet (5 mg total) by mouth every 4 (four) hours as needed for severe pain.   traMADol 50 MG tablet Commonly known as: ULTRAM Take 1 tablet (50 mg total) by mouth every 4 (four) hours as needed for moderate pain.               Durable Medical Equipment  (From admission, onward)           Start     Ordered    10/27/21 1228  DME Walker rolling  Once       Question:  Patient needs a walker to treat with the following condition  Answer:  Total knee replacement status   10/27/21 1227   10/27/21 1228  DME Bedside commode  Once       Question:  Patient needs a bedside commode to treat with the following condition  Answer:  Total knee replacement status   10/27/21 1227            Disposition: Home with home health PT     Follow-up Information     Fausto Skillern, PA-C Follow up on 11/11/2021.   Specialty: Orthopedic Surgery Why: at 9:45am Contact information: [place] [id number] [phone]         Dereck Leep, MD Follow up on 12/14/2021.   Specialty: Orthopedic Surgery Why: at 9:30am Contact information: [place]  [id number] [place], PA-C 10/28/2021, 11:48 AM

## 2021-10-28 NOTE — Progress Notes (Signed)
  Subjective: 1 Day Post-Op Procedure(s) (LRB): COMPUTER ASSISTED TOTAL KNEE ARTHROPLASTY (Right) Patient reports pain as well-controlled.   Patient is well, and has had no acute complaints or problems Plan is to go Home after hospital stay. Negative for chest pain and shortness of breath Fever: no Gastrointestinal: negative for nausea and vomiting.   Patient has not had a bowel movement.  Objective: Vital signs in last 24 hours: Temp:  [97.7 F (36.5 C)-99.1 F (37.3 C)] 97.8 F (36.6 C) (10/12 0820) Pulse Rate:  [63-90] 67 (10/12 0820) Resp:  [11-23] 17 (10/12 0820) BP: (128-142)/(69-92) 142/71 (10/12 0820) SpO2:  [95 %-99 %] 98 % (10/12 0820)  Intake/Output from previous day:  Intake/Output Summary (Last 24 hours) at 10/28/2021 0828 Last data filed at 10/28/2021 0553 Gross per 24 hour  Intake 3332.65 ml  Output 815 ml  Net 2517.65 ml    Intake/Output this shift: No intake/output data recorded.  Labs: No results for input(s): "HGB" in the last 72 hours. No results for input(s): "WBC", "RBC", "HCT", "PLT" in the last 72 hours. No results for input(s): "NA", "K", "CL", "CO2", "BUN", "CREATININE", "GLUCOSE", "CALCIUM" in the last 72 hours. No results for input(s): "LABPT", "INR" in the last 72 hours.   EXAM General - Patient is Alert, Appropriate, and Oriented Extremity - Neurovascular intact Dorsiflexion/Plantar flexion intact Compartment soft Dressing/Incision -Postoperative dressing remains in place., Polar Care in place and working. , Hemovac in place. , Following removal of post-op dressing, minimal bloody drainage distally Motor Function - intact, moving foot and toes well on exam.    Cardiovascular- Regular rate and rhythm, no murmurs/rubs/gallops Respiratory- Lungs clear to auscultation bilaterally Gastrointestinal- soft, nontender, and active bowel sounds   Assessment/Plan: 1 Day Post-Op Procedure(s) (LRB): COMPUTER ASSISTED TOTAL KNEE ARTHROPLASTY  (Right) Principal Problem:   Total knee replacement status  Estimated body mass index is 28.94 kg/m as calculated from the following:   Height as of 10/20/21: 5' 6.5" (1.689 m).   Weight as of 10/20/21: 82.6 kg. Advance diet Up with therapy  Anticipate d/c this PM pending completion of therapy goals.   Post-op dressing removed. , Hemovac removed., and Mini compression dressing applied.   DVT Prophylaxis - Lovenox, Ted hose, and SCDs Weight-Bearing as tolerated to right leg  Cassell Smiles, PA-C [facility] Orthopaedic Surgery 10/28/2021, 8:28 AM

## 2021-10-28 NOTE — Progress Notes (Signed)
Spoke with the patient, She has her sister going to assist her and picking her up today She has a rolling walker and a rollator, a raised toilet, she declined a 3 in 1 [place] is set up for [facility] services

## 2021-10-28 NOTE — Progress Notes (Signed)
Physical Therapy Treatment Patient Details Name: Carrie Pham MRN: [id number] DOB: May 12, 1960 Today's Date: 10/28/2021   History of Present Illness 61 y/o female s/p R TKA 10/27/21.    PT Comments    Pt was sitting in recliner upon arrival eager to have PT session. " I want to go home today." She is A and O x 4 and endorsing "Just a little pain." She easily was able to stand, ambulate 200 + ft and perform stairs without physical assist. Once returned to room, reviewed and perform HEP. AROM RLE 2-115 degrees. Pt is cleared from an acute PT standpoint for safe DC home with HHPT to follow. Will benefit from continued PT to address deficits while advancing pt to maximal independence.    Recommendations for follow up therapy are one component of a multi-disciplinary discharge planning process, led by the attending physician.  Recommendations may be updated based on patient status, additional functional criteria and insurance authorization.  Follow Up Recommendations  Follow physician's recommendations for discharge plan and follow up therapies     Assistance Recommended at Discharge PRN  Patient can return home with the following Assist for transportation   Equipment Recommendations  None recommended by PT       Precautions / Restrictions Precautions Precautions: Fall;Knee Precaution Booklet Issued: Yes (comment) Precaution Comments: HEP Restrictions Weight Bearing Restrictions: Yes RLE Weight Bearing: Weight bearing as tolerated     Mobility  Bed Mobility Overal bed mobility: Modified Independent   Transfers Overall transfer level: Modified independent Equipment used: Rolling walker (2 wheels) Transfers: Sit to/from Stand Sit to Stand: Modified independent (Device/Increase time)     Ambulation/Gait Ambulation/Gait assistance: Supervision Gait Distance (Feet): 200 Feet Assistive device: Rolling walker (2 wheels) Gait Pattern/deviations: Step-through pattern, Antalgic,  Trunk flexed Gait velocity: decreased     General Gait Details: Pt was safely and easily able to ambulate 200 ft with RW. No LOB or safety concerns.   Stairs Stairs: Yes Stairs assistance: Supervision Stair Management: One rail Right, Sideways, Step to pattern Number of Stairs: 4 General stair comments: Pt was able to ascend/descend stairs without assistance. Demonstrated safe ability to enter her home.   Balance Overall balance assessment: Needs assistance Sitting-balance support: No upper extremity supported Sitting balance-Leahy Scale: Good     Standing balance support: Bilateral upper extremity supported Standing balance-Leahy Scale: Good       Cognition Arousal/Alertness: Awake/alert Behavior During Therapy: WFL for tasks assessed/performed Overall Cognitive Status: Within Functional Limits for tasks assessed    General Comments: Pt is A and O x 4        Exercises Total Joint Exercises Ankle Circles/Pumps: AROM, 10 reps Quad Sets: AROM, 10 reps Gluteal Sets: 10 reps Heel Slides: Seated, AROM, 5 reps Straight Leg Raises: AROM, 10 reps Long Arc Quad: AROM, 10 reps Knee Flexion: AROM, 5 reps, Seated Goniometric ROM: 2-115        Pertinent Vitals/Pain Pain Assessment Pain Assessment: 0-10 Pain Score: 6  Pain Location: R knee Pain Descriptors / Indicators: Aching, Discomfort Pain Intervention(s): Limited activity within patient's tolerance, Monitored during session, Premedicated before session, Ice applied    Home Living Family/patient expects to be discharged to:: Private residence Living Arrangements: Spouse/significant other Available Help at Discharge: Family;Available PRN/intermittently Type of Home: House Home Access: Stairs to enter Entrance Stairs-Rails: Right Entrance Stairs-Number of Steps: 5   Home Layout: One level Home Equipment: Agricultural consultant (2 wheels)          PT Goals (current  goals can now be found in the care plan section) Acute  Rehab PT Goals Patient Stated Goal: go home then have my other need done this year." Progress towards PT goals: Progressing toward goals    Frequency    BID      PT Plan Current plan remains appropriate       AM-PAC PT "6 Clicks" Mobility   Outcome Measure  Help needed turning from your back to your side while in a flat bed without using bedrails?: None Help needed moving from lying on your back to sitting on the side of a flat bed without using bedrails?: None Help needed moving to and from a bed to a chair (including a wheelchair)?: A Little Help needed standing up from a chair using your arms (e.g., wheelchair or bedside chair)?: A Little Help needed to walk in hospital room?: A Little Help needed climbing 3-5 steps with a railing? : A Little 6 Click Score: 20    End of Session Equipment Utilized During Treatment: Gait belt Activity Tolerance: Patient tolerated treatment well Patient left: with call bell/phone within reach;with bed alarm set;in bed Nurse Communication: Mobility status PT Visit Diagnosis: Muscle weakness (generalized) (M62.81);Difficulty in walking, not elsewhere classified (R26.2);Pain Pain - Right/Left: Right Pain - part of body: Knee     Time: 1040-1107 PT Time Calculation (min) (ACUTE ONLY): 27 min  Charges:  $Gait Training: 8-22 mins $Therapeutic Exercise: 8-22 mins                    Julaine Fusi PTA 10/28/21, 11:40 AM

## 2021-10-29 DIAGNOSIS — G8929 Other chronic pain: Secondary | ICD-10-CM | POA: Diagnosis not present

## 2021-10-29 DIAGNOSIS — E663 Overweight: Secondary | ICD-10-CM | POA: Diagnosis not present

## 2021-10-29 DIAGNOSIS — G4700 Insomnia, unspecified: Secondary | ICD-10-CM | POA: Diagnosis not present

## 2021-10-29 DIAGNOSIS — D649 Anemia, unspecified: Secondary | ICD-10-CM | POA: Diagnosis not present

## 2021-10-29 DIAGNOSIS — Z471 Aftercare following joint replacement surgery: Secondary | ICD-10-CM | POA: Diagnosis not present

## 2021-10-29 DIAGNOSIS — Z791 Long term (current) use of non-steroidal anti-inflammatories (NSAID): Secondary | ICD-10-CM | POA: Diagnosis not present

## 2021-10-29 DIAGNOSIS — Z96651 Presence of right artificial knee joint: Secondary | ICD-10-CM | POA: Diagnosis not present

## 2021-10-29 DIAGNOSIS — M797 Fibromyalgia: Secondary | ICD-10-CM | POA: Diagnosis not present

## 2021-10-29 DIAGNOSIS — Z6833 Body mass index (BMI) 33.0-33.9, adult: Secondary | ICD-10-CM | POA: Diagnosis not present

## 2021-10-29 DIAGNOSIS — E119 Type 2 diabetes mellitus without complications: Secondary | ICD-10-CM | POA: Diagnosis not present

## 2021-10-29 DIAGNOSIS — I10 Essential (primary) hypertension: Secondary | ICD-10-CM | POA: Diagnosis not present

## 2021-10-29 DIAGNOSIS — G2581 Restless legs syndrome: Secondary | ICD-10-CM | POA: Diagnosis not present

## 2021-10-29 DIAGNOSIS — R69 Illness, unspecified: Secondary | ICD-10-CM | POA: Diagnosis not present

## 2021-10-29 DIAGNOSIS — M1712 Unilateral primary osteoarthritis, left knee: Secondary | ICD-10-CM | POA: Diagnosis not present

## 2021-10-29 DIAGNOSIS — E039 Hypothyroidism, unspecified: Secondary | ICD-10-CM | POA: Diagnosis not present

## 2021-10-29 DIAGNOSIS — Z9181 History of falling: Secondary | ICD-10-CM | POA: Diagnosis not present

## 2021-10-29 DIAGNOSIS — Z7984 Long term (current) use of oral hypoglycemic drugs: Secondary | ICD-10-CM | POA: Diagnosis not present

## 2021-10-31 DIAGNOSIS — Z471 Aftercare following joint replacement surgery: Secondary | ICD-10-CM | POA: Diagnosis not present

## 2021-11-03 DIAGNOSIS — Z471 Aftercare following joint replacement surgery: Secondary | ICD-10-CM | POA: Diagnosis not present

## 2021-11-04 DIAGNOSIS — Z471 Aftercare following joint replacement surgery: Secondary | ICD-10-CM | POA: Diagnosis not present

## 2021-11-05 DIAGNOSIS — Z471 Aftercare following joint replacement surgery: Secondary | ICD-10-CM | POA: Diagnosis not present

## 2021-11-08 DIAGNOSIS — Z471 Aftercare following joint replacement surgery: Secondary | ICD-10-CM | POA: Diagnosis not present

## 2021-11-10 DIAGNOSIS — G2581 Restless legs syndrome: Secondary | ICD-10-CM | POA: Diagnosis not present

## 2021-11-10 DIAGNOSIS — E663 Overweight: Secondary | ICD-10-CM | POA: Diagnosis not present

## 2021-11-10 DIAGNOSIS — D649 Anemia, unspecified: Secondary | ICD-10-CM | POA: Diagnosis not present

## 2021-11-10 DIAGNOSIS — Z791 Long term (current) use of non-steroidal anti-inflammatories (NSAID): Secondary | ICD-10-CM | POA: Diagnosis not present

## 2021-11-10 DIAGNOSIS — G4700 Insomnia, unspecified: Secondary | ICD-10-CM | POA: Diagnosis not present

## 2021-11-10 DIAGNOSIS — M1712 Unilateral primary osteoarthritis, left knee: Secondary | ICD-10-CM | POA: Diagnosis not present

## 2021-11-10 DIAGNOSIS — E039 Hypothyroidism, unspecified: Secondary | ICD-10-CM | POA: Diagnosis not present

## 2021-11-10 DIAGNOSIS — M797 Fibromyalgia: Secondary | ICD-10-CM | POA: Diagnosis not present

## 2021-11-10 DIAGNOSIS — Z6833 Body mass index (BMI) 33.0-33.9, adult: Secondary | ICD-10-CM | POA: Diagnosis not present

## 2021-11-10 DIAGNOSIS — Z7984 Long term (current) use of oral hypoglycemic drugs: Secondary | ICD-10-CM | POA: Diagnosis not present

## 2021-11-10 DIAGNOSIS — E119 Type 2 diabetes mellitus without complications: Secondary | ICD-10-CM | POA: Diagnosis not present

## 2021-11-10 DIAGNOSIS — I10 Essential (primary) hypertension: Secondary | ICD-10-CM | POA: Diagnosis not present

## 2021-11-10 DIAGNOSIS — Z96651 Presence of right artificial knee joint: Secondary | ICD-10-CM | POA: Diagnosis not present

## 2021-11-10 DIAGNOSIS — G8929 Other chronic pain: Secondary | ICD-10-CM | POA: Diagnosis not present

## 2021-11-10 DIAGNOSIS — Z9181 History of falling: Secondary | ICD-10-CM | POA: Diagnosis not present

## 2021-11-10 DIAGNOSIS — Z471 Aftercare following joint replacement surgery: Secondary | ICD-10-CM | POA: Diagnosis not present

## 2021-11-10 DIAGNOSIS — R69 Illness, unspecified: Secondary | ICD-10-CM | POA: Diagnosis not present

## 2021-11-11 DIAGNOSIS — M25461 Effusion, right knee: Secondary | ICD-10-CM | POA: Diagnosis not present

## 2021-11-11 DIAGNOSIS — M25561 Pain in right knee: Secondary | ICD-10-CM | POA: Diagnosis not present

## 2021-11-16 DIAGNOSIS — M25461 Effusion, right knee: Secondary | ICD-10-CM | POA: Diagnosis not present

## 2021-11-16 DIAGNOSIS — M25561 Pain in right knee: Secondary | ICD-10-CM | POA: Diagnosis not present

## 2021-11-18 DIAGNOSIS — M25461 Effusion, right knee: Secondary | ICD-10-CM | POA: Diagnosis not present

## 2021-11-18 DIAGNOSIS — M25561 Pain in right knee: Secondary | ICD-10-CM | POA: Diagnosis not present

## 2021-11-23 DIAGNOSIS — M25561 Pain in right knee: Secondary | ICD-10-CM | POA: Diagnosis not present

## 2021-11-23 DIAGNOSIS — M25461 Effusion, right knee: Secondary | ICD-10-CM | POA: Diagnosis not present

## 2021-11-25 DIAGNOSIS — M25561 Pain in right knee: Secondary | ICD-10-CM | POA: Diagnosis not present

## 2021-11-25 DIAGNOSIS — M25461 Effusion, right knee: Secondary | ICD-10-CM | POA: Diagnosis not present

## 2021-11-30 DIAGNOSIS — M25561 Pain in right knee: Secondary | ICD-10-CM | POA: Diagnosis not present

## 2021-11-30 DIAGNOSIS — M25461 Effusion, right knee: Secondary | ICD-10-CM | POA: Diagnosis not present

## 2021-12-02 DIAGNOSIS — M25561 Pain in right knee: Secondary | ICD-10-CM | POA: Diagnosis not present

## 2021-12-02 DIAGNOSIS — M25461 Effusion, right knee: Secondary | ICD-10-CM | POA: Diagnosis not present

## 2021-12-08 DIAGNOSIS — M25561 Pain in right knee: Secondary | ICD-10-CM | POA: Diagnosis not present

## 2021-12-08 DIAGNOSIS — M25461 Effusion, right knee: Secondary | ICD-10-CM | POA: Diagnosis not present

## 2021-12-14 DIAGNOSIS — Z96651 Presence of right artificial knee joint: Secondary | ICD-10-CM | POA: Diagnosis not present

## 2021-12-29 NOTE — Discharge Instructions (Signed)
Instructions after Total Knee Replacement   Desmen Schoffstall P. Myran Arcia, Jr., M.D.     Dept. of Orthopaedics & Sports Medicine  [facility]  [address]  [place], [place]  [address]  Phone: [phone]   Fax: [phone]    DIET: Drink plenty of non-alcoholic fluids. Resume your normal diet. Include foods high in fiber.  ACTIVITY:  You may use crutches or a walker with weight-bearing as tolerated, unless instructed otherwise. You may be weaned off of the walker or crutches by your Physical Therapist.  Do NOT place pillows under the knee. Anything placed under the knee could limit your ability to straighten the knee.   Continue doing gentle exercises. Exercising will reduce the pain and swelling, increase motion, and prevent muscle weakness.   Please continue to use the TED compression stockings for 6 weeks. You may remove the stockings at night, but should reapply them in the morning. Do not drive or operate any equipment until instructed.  WOUND CARE:  Continue to use the PolarCare or ice packs periodically to reduce pain and swelling. You may bathe or shower after the staples are removed at the first office visit following surgery.  MEDICATIONS: You may resume your regular medications. Please take the pain medication as prescribed on the medication. Do not take pain medication on an empty stomach. You have been given a prescription for a blood thinner (Lovenox or Coumadin). Please take the medication as instructed. (NOTE: After completing a 2 week course of Lovenox, take one Enteric-coated aspirin once a day. This along with elevation will help reduce the possibility of phlebitis in your operated leg.) Do not drive or drink alcoholic beverages when taking pain medications.  CALL THE OFFICE FOR: Temperature above 101 degrees Excessive bleeding or drainage on the dressing. Excessive swelling, coldness, or paleness of the toes. Persistent nausea and vomiting.  FOLLOW-UP:  You  should have an appointment to return to the office in 10-14 days after surgery. Arrangements have been made for continuation of Physical Therapy (either home therapy or outpatient therapy).   [facility] Department Directory         [email or web address]       [email or web address]          Cardiology  Appointments: [place] - [phone] [place] - [phone]  Endocrinology  Appointments: [place] - [phone] [place] - [phone]  Gastroenterology  Appointments: [place] - [phone] [place] - [phone]        General Surgery   Appointments: [place] - [phone]  Internal Medicine/Family Medicine  Appointments: [place] - [phone] [place] - [phone] [place] - [phone]  Metabolic and Weigh Loss Surgery  Appointments: [place] - [phone]        Neurology  Appointments: [place] - [phone] [place] - [phone]  Neurosurgery  Appointments: [place] - [phone]  Obstetrics & Gynecology  Appointments: [place] - [phone] [place] - [phone]        Pediatrics  Appointments: [place] - [phone] [place] - [phone]  Physiatry  Appointments: [place] -[phone]  Physical Therapy  Appointments: [place] - [phone] [place] - [phone]        Podiatry  Appointments: [place] - [phone] [place] - [phone]  Pulmonology  Appointments: [place] - [phone]  Rheumatology  Appointments: [place] - [phone]         Location: [facility]  [address] , [place]  [address]  [place] Location: [facility] [address] [place], [place]  [address]  [place] Location: [facility] [address] [place], [place]  [address]    

## 2022-01-03 ENCOUNTER — Encounter
Admission: RE | Admit: 2022-01-03 | Discharge: 2022-01-03 | Disposition: A | Discharge status: Home / Self Care | Payer: 59 | Source: Ambulatory Visit | Attending: Orthopedic Surgery | Admitting: Orthopedic Surgery

## 2022-01-03 ENCOUNTER — Other Ambulatory Visit: Payer: Self-pay | Admitting: Family Medicine

## 2022-01-03 DIAGNOSIS — I10 Essential (primary) hypertension: Secondary | ICD-10-CM | POA: Diagnosis not present

## 2022-01-03 DIAGNOSIS — E118 Type 2 diabetes mellitus with unspecified complications: Secondary | ICD-10-CM | POA: Diagnosis not present

## 2022-01-03 DIAGNOSIS — Z01812 Encounter for preprocedural laboratory examination: Secondary | ICD-10-CM | POA: Diagnosis not present

## 2022-01-03 DIAGNOSIS — D649 Anemia, unspecified: Secondary | ICD-10-CM | POA: Insufficient documentation

## 2022-01-03 DIAGNOSIS — M1712 Unilateral primary osteoarthritis, left knee: Secondary | ICD-10-CM | POA: Diagnosis not present

## 2022-01-03 DIAGNOSIS — E039 Hypothyroidism, unspecified: Secondary | ICD-10-CM

## 2022-01-03 DIAGNOSIS — Z01810 Encounter for preprocedural cardiovascular examination: Secondary | ICD-10-CM | POA: Diagnosis not present

## 2022-01-03 LAB — SURGICAL PCR SCREEN
MRSA, PCR: POSITIVE — AB
Staphylococcus aureus: POSITIVE — AB

## 2022-01-03 LAB — URINALYSIS, ROUTINE W REFLEX MICROSCOPIC
Bilirubin Urine: NEGATIVE
Glucose, UA: NEGATIVE mg/dL
Ketones, ur: NEGATIVE mg/dL
Nitrite: NEGATIVE
Protein, ur: NEGATIVE mg/dL
Specific Gravity, Urine: 1.024 (ref 1.005–1.030)
pH: 5 (ref 5.0–8.0)

## 2022-01-03 LAB — CBC
HCT: 36.8 % (ref 36.0–46.0)
Hemoglobin: 11.4 g/dL — ABNORMAL LOW (ref 12.0–15.0)
MCH: 27.5 pg (ref 26.0–34.0)
MCHC: 31 g/dL (ref 30.0–36.0)
MCV: 88.7 fL (ref 80.0–100.0)
Platelets: 275 10*3/uL (ref 150–400)
RBC: 4.15 MIL/uL (ref 3.87–5.11)
RDW: 13.1 % (ref 11.5–15.5)
WBC: 6.4 10*3/uL (ref 4.0–10.5)
nRBC: 0 % (ref 0.0–0.2)

## 2022-01-03 LAB — COMPREHENSIVE METABOLIC PANEL
ALT: 11 U/L (ref 0–44)
AST: 15 U/L (ref 15–41)
Albumin: 3.9 g/dL (ref 3.5–5.0)
Alkaline Phosphatase: 89 U/L (ref 38–126)
Anion gap: 5 (ref 5–15)
BUN: 16 mg/dL (ref 8–23)
CO2: 27 mmol/L (ref 22–32)
Calcium: 9.3 mg/dL (ref 8.9–10.3)
Chloride: 106 mmol/L (ref 98–111)
Creatinine, Ser: 0.58 mg/dL (ref 0.44–1.00)
GFR, Estimated: >60 mL/min (ref >60)
Glucose, Bld: 101 mg/dL — ABNORMAL HIGH (ref 70–99)
Potassium: 4 mmol/L (ref 3.5–5.1)
Sodium: 138 mmol/L (ref 135–145)
Total Bilirubin: 0.6 mg/dL (ref 0.3–1.2)
Total Protein: 8 g/dL (ref 6.5–8.1)

## 2022-01-03 LAB — TYPE AND SCREEN
ABO/RH(D): O POS
Antibody Screen: NEGATIVE

## 2022-01-03 LAB — C-REACTIVE PROTEIN: CRP: 0.6 mg/dL (ref <1.0)

## 2022-01-03 LAB — HEMOGLOBIN A1C
Hgb A1c MFr Bld: 6.1 % — ABNORMAL HIGH (ref 4.8–5.6)
Mean Plasma Glucose: 128 mg/dL

## 2022-01-03 LAB — SEDIMENTATION RATE: Sed Rate: 43 mm/hr — ABNORMAL HIGH (ref 0–30)

## 2022-01-03 NOTE — Patient Instructions (Addendum)
Your procedure is scheduled on: Friday January 14, 2022 Report to Day Surgery inside [facility] 2nd floor, stop by registration desk before getting on elevator.  To find out your arrival time please call [phone] between 1PM - 3PM on Thursday January 13, 2022.  Remember: Instructions that are not followed completely may result in serious medical risk,  up to and including death, or upon the discretion of your surgeon and anesthesiologist your  surgery may need to be rescheduled.     _X__ 1. Do not eat food after midnight the night before your procedure.                 No chewing gum or hard candies. You may drink clear liquids up to 2 hours                 before you are scheduled to arrive for your surgery- DO not drink clear                 liquids within 2 hours of the start of your surgery.                 Clear Liquids include:  water, G2 or                  Gatorade Zero (avoid Red/Purple/Blue), Black Coffee or Tea (Do not add                 anything to coffee or tea).  __X__2.   Complete the "Ensure Clear Pre-surgery Clear Carbohydrate Drink" provided to you, 2 hours before arrival. **If you are diabetic you will be provided with an alternative drink, Gatorade Zero or G2.  __X__3.  On the morning of surgery brush your teeth with toothpaste and water, you                may rinse your mouth with mouthwash if you wish.  Do not swallow any toothpaste of mouthwash.     _X__ 4.  No Alcohol for 24 hours before or after surgery.   _X__ 5.  Do Not Smoke or use e-cigarettes For 24 Hours Prior to Your Surgery.                 Do not use any chewable tobacco products for at least 6 hours prior to                 Surgery.  _X__  6.  Do not use any recreational drugs (marijuana, cocaine, heroin, ecstasy, MDMA or other)                For at least one week prior to your surgery.  Combination of these drugs with anesthesia                May have life  threatening results.  ____  7.  Bring all medications with you on the day of surgery if instructed.   __X__ 8.  Notify your doctor if there is any change in your medical condition      (cold, fever, infections).     Do not wear jewelry, make-up, hairpins, clips or nail polish. Do not wear lotions, powders, or perfumes. You may wear deodorant. Do not shave 48 hours prior to surgery. Men may shave face and neck. Do not bring valuables to the hospital.    [facility] is not responsible for any belongings or valuables.  Contacts, dentures or bridgework may not  be worn into surgery. Leave your suitcase in the car. After surgery it may be brought to your room. For patients admitted to the hospital, discharge time is determined by your treatment team.   Patients discharged the day of surgery will not be allowed to drive home.   Make arrangements for someone to be with you for the first 24 hours of your Same Day Discharge.   __X__ Take these medicines the morning of surgery with A SIP OF WATER:    1. citalopram (CELEXA) 20 MG   2. levothyroxine (EUTHYROX) 125 MCG   3. metoprolol tartrate (LOPRESSOR) 50 MG   4.  5.  6.  ____ Fleet Enema (as directed)   __X__ Use CHG Soap (or wipes) as directed  ____ Use Benzoyl Peroxide Gel as instructed  ____ Use inhalers on the day of surgery  __X__ Stop metformin 2 days prior to surgery (take last dose 01/11/22)   ____ Take 1/2 of usual insulin dose the night before surgery. No insulin the morning          of surgery.   ____ Call your PCP, cardiologist, or Pulmonologist if taking Coumadin/Plavix/aspirin and ask when to stop before your surgery.   __X__ One Week prior to surgery- Stop Anti-inflammatories such as Ibuprofen, Aleve, Advil, Motrin, meloxicam (MOBIC), diclofenac, etodolac, ketorolac, Toradol, Daypro, piroxicam, Goody's or BC powders. OK TO USE TYLENOL IF NEEDED   __X__ Stop supplements until after surgery.    ____ Bring C-Pap  to the hospital.    If you have any questions regarding your pre-procedure instructions,  Please call Pre-admit Testing at [phone]    Preparing for Surgery with CHLORHEXIDINE GLUCONATE (CHG) Soap  Chlorhexidine Gluconate (CHG) Soap  o An antiseptic cleaner that kills germs and bonds with the skin to continue killing germs even after washing  o Used for showering the night before surgery and morning of surgery  Before surgery, you can play an important role by reducing the number of germs on your skin.  CHG (Chlorhexidine gluconate) soap is an antiseptic cleanser which kills germs and bonds with the skin to continue killing germs even after washing.  Please do not use if you have an allergy to CHG or antibacterial soaps. If your skin becomes reddened/irritated stop using the CHG.  1. Shower the NIGHT BEFORE SURGERY and the MORNING OF SURGERY with CHG soap.  2. If you choose to wash your hair, wash your hair first as usual with your normal shampoo.  3. After shampooing, rinse your hair and body thoroughly to remove the shampoo.  4. Use CHG as you would any other liquid soap. You can apply CHG directly to the skin and wash gently with a scrungie or a clean washcloth.  5. Apply the CHG soap to your body only from the neck down. Do not use on open wounds or open sores. Avoid contact with your eyes, ears, mouth, and genitals (private parts). Wash face and genitals (private parts) with your normal soap.  6. Wash thoroughly, paying special attention to the area where your surgery will be performed.  7. Thoroughly rinse your body with warm water.  8. Do not shower/wash with your normal soap after using and rinsing off the CHG soap.  9. Pat yourself dry with a clean towel.  10. Wear clean pajamas to bed the night before surgery.  12. Place clean sheets on your bed the night of your first shower and do not sleep with pets.  13. Shower again  with the CHG soap on the day of surgery  prior to arriving at the hospital.  14. Do not apply any deodorants/lotions/powders.  15. Please wear clean clothes to the hospital.

## 2022-01-03 NOTE — Progress Notes (Signed)
  Perioperative Services  Abnormal Lab Notification and Treatment Plan of Care  Date: 01/03/22  Name: Carrie Pham MRN:   [id number]  Re: Abnormal labs noted during PAT appointment  Provider Notified: Donato Heinz, MD Notification mode: Routed and/or faxed via [facility]  Labs of concern: Lab Results  Component Value Date   STAPHAUREUS POSITIVE (A) 10/20/2021   MRSAPCR POSITIVE (A) 10/20/2021    Notes: Patient is scheduled for a COMPUTER ASSISTED TOTAL KNEE ARTHROPLASTY - RNFA (Left: Knee) on 01/14/2022.  She is scheduled to receive CEFAZOLIN pre-operatively. Surgical PCR (+) for MRSA; see above.  PLANS:  Carrie Pham is scheduled for the above procedure with Dr. Francesco Sor, MD.  Preoperative testing revealed a (+) MRSA PCR screen. Patient will need additional preoperative antimicrobial coverage for this pathogen.  Sending result to primary attending surgeon for review.  Surgeon to place further orders as deemed appropriate for this case.  Quentin Mulling, MSN, APRN, FNP-C, CEN [facility]  Peri-operative Services Nurse Practitioner Phone: [phone] 01/03/22 12:30 PM

## 2022-01-04 NOTE — Telephone Encounter (Signed)
E-scribed refill.  Plz schedule CPE and lab visits to prevent delays in future refills.  

## 2022-01-04 NOTE — Telephone Encounter (Signed)
Spoke to pt, scheduled cpe for 05/24/22

## 2022-01-07 DIAGNOSIS — M1712 Unilateral primary osteoarthritis, left knee: Secondary | ICD-10-CM | POA: Diagnosis not present

## 2022-01-13 ENCOUNTER — Encounter: Payer: Self-pay | Admitting: Orthopedic Surgery

## 2022-01-13 NOTE — H&P (Signed)
ORTHOPAEDIC HISTORY & PHYSICAL Michelene Gardener, [place] - 01/07/2022 8:15 AM EST Formatting of this note is different from the original. [facility] Chief Complaint: No chief complaint on file.  History of Present Illness:  Carrie Pham is a 61 y.o. female that presents to clinic today for her preoperative history and evaluation. Patient presents unaccompanied. The patient is scheduled to undergo a left total knee arthroplasty on 01/14/22 by Dr. Ernest Pine. Her pain began several years ago. The pain is located primarily along the medial aspect of the knee. She describes her pain as worse with weightbearing. She reports associated swelling with very giving way of the knee. She denies associated numbness or tingling.  The patient's symptoms have progressed to the point that they decrease her quality of life. The patient has previously undergone conservative treatment including NSAIDS and injections to the knee without adequate control of her symptoms.  Denies significant cardiac history, history of lumbar surgery, or DVT. Her husband will be home to assist after surgery.  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  Past Medical, Surgical, Family, Social History, Allergies, Medications:  Past Medical History: Past Medical History: Diagnosis Date Diabetes mellitus without complication (CMS-HCC) Hypertension Hypothyroidism  Past Surgical History: Past Surgical History: Procedure Laterality Date Right total knee arthroplasty using computer-assisted navigation 10/27/2021 Dr Ernest Pine CESAREAN SECTION HYSTERECTOMY  Current Medications: Current Outpatient Medications Medication Sig Dispense Refill acetaminophen (TYLENOL) 500 MG tablet Take 1,000 mg by mouth 2 (two) times daily as needed for Pain amoxicillin (AMOXIL) 500 MG capsule Take 4 capsules 1 hour before the dental procedure 4 capsule 2 celecoxib (CELEBREX) 200 MG capsule  Take 200 mg by mouth 2 (two) times daily citalopram (CELEXA) 20 MG tablet Take 20 mg by mouth once daily fluconazole (DIFLUCAN) 100 MG tablet Take 100 mg by mouth once daily as needed levothyroxine (SYNTHROID) 125 MCG tablet Take 125 mcg by mouth every morning before breakfast melatonin 10 mg Tab Take 1 tablet by mouth at bedtime as needed metFORMIN (GLUCOPHAGE) 1000 MG tablet Take 1,000 mg by mouth daily with breakfast metoprolol tartrate (LOPRESSOR) 50 MG tablet Take 50 mg by mouth 2 (two) times daily naproxen sodium (ALEVE) 220 MG tablet Take 440 mg by mouth once daily as needed valACYclovir (VALTREX) 1000 MG tablet Take 1,000 mg by mouth once daily  No current facility-administered medications for this visit.  Allergies: Allergies Allergen Reactions Codeine Itching  Social History: Social History  Socioeconomic History Marital status: Married Spouse name: Jason Nest Number of children: 3 Years of education: 12 Highest education level: High school graduate Occupational History Occupation: Environmental education officer - Musician- Programmer, systems Tobacco Use Smoking status: Never Smokeless tobacco: Never Vaping Use Vaping Use: Never used Substance and Sexual Activity Alcohol use: Not Currently Comment: rarely Drug use: Never Sexual activity: Yes Partners: Male  Family History: No family history on file.  Review of Systems:  A 10+ ROS was performed, reviewed, and the pertinent orthopaedic findings are documented in the HPI.  Physical Examination:  There were no vitals taken for this visit.  Patient is a well-developed, well-nourished female in no acute distress. Patient has normal mood and affect. Patient is alert and oriented to person, place, and time.  HEENT: Atraumatic, normocephalic. Pupils equal and reactive to light. Extraocular motion intact. Noninjected sclera.  Cardiovascular: Regular rate and rhythm, with no murmurs, rubs, or gallops. Distal pulses  palpable.  Respiratory: Lungs clear to auscultation bilaterally.  Left Knee: Soft tissue swelling: mild Effusion: minimal Erythema: none Crepitance: mild Tenderness: medial joint line Alignment: relative varus Mediolateral laxity: medial pseudolaxity Anterior drawer test:negative Lachman`s test: negative McMurray`s test: negative Atrophy: No significant atrophy. Quadriceps tone was fair to good. Range of Motion: 0/10/128  Patient able to dorsiflex and plantarflex the left ankle. Able to flex and extend the toes.  Sensation intact over the saphenous, lateral sural cutaneous, superficial fibular, and deep fibular nerve distributions.  Tests Performed/Reviewed: X-rays  3 views of the left knee were obtained. Images reveal severe loss of medial compartment joint space with bone-on-bone contact, osteophyte formation, and subchondral sclerosis. No fractures or dislocations. No other osseous abnormality noted  I personally ordered and interpreted today's x-rays.  Impression:  ICD-10-CM 1. Primary osteoarthritis of left knee M17.12  Plan:  The patient has end-stage degenerative changes of the left knee. It was explained to the patient that the condition is progressive in nature. Having failed conservative treatment, the patient has elected to proceed with a total joint arthroplasty. The patient will undergo a total joint arthroplasty with Dr. Ernest Pine. The risks of surgery, including blood clot and infection, were discussed with the patient. Measures to reduce these risks, including the use of anticoagulation, perioperative antibiotics, and early ambulation were discussed. The importance of postoperative physical therapy was discussed with the patient. The patient elects to proceed with surgery. The patient is instructed to stop all blood thinners prior to surgery. The patient is instructed to call the hospital the day before surgery to learn of the proper arrival time.  Contact our  office with any questions or concerns. Follow up as indicated, or sooner should any new problems arise, if conditions worsen, or if they are otherwise concerned.  Michelene Gardener, PA -C [facility] [address] [place], [place] [address] Phone: [phone]  This note was generated in part with voice recognition software and I apologize for any typographical errors that were not detected and corrected.  Electronically signed by Michelene Gardener, PA at 01/07/2022 5:47 PM EST

## 2022-01-14 ENCOUNTER — Other Ambulatory Visit: Payer: Self-pay

## 2022-01-14 ENCOUNTER — Encounter: Payer: Self-pay | Admitting: Orthopedic Surgery

## 2022-01-14 ENCOUNTER — Encounter
Admission: RE | Disposition: A | Discharge status: Home / Self Care | Payer: Self-pay | Source: Home / Self Care | Attending: Orthopedic Surgery

## 2022-01-14 ENCOUNTER — Observation Stay
Admission: RE | Admit: 2022-01-14 | Discharge: 2022-01-15 | Disposition: A | Discharge status: Home / Self Care | Payer: 59 | Attending: Orthopedic Surgery | Admitting: Orthopedic Surgery

## 2022-01-14 ENCOUNTER — Ambulatory Visit: Payer: 59 | Admitting: Certified Registered"

## 2022-01-14 ENCOUNTER — Observation Stay: Payer: 59

## 2022-01-14 ENCOUNTER — Ambulatory Visit: Payer: 59 | Admitting: Urgent Care

## 2022-01-14 DIAGNOSIS — D649 Anemia, unspecified: Secondary | ICD-10-CM

## 2022-01-14 DIAGNOSIS — I10 Essential (primary) hypertension: Secondary | ICD-10-CM | POA: Insufficient documentation

## 2022-01-14 DIAGNOSIS — Z7984 Long term (current) use of oral hypoglycemic drugs: Secondary | ICD-10-CM | POA: Insufficient documentation

## 2022-01-14 DIAGNOSIS — Z96651 Presence of right artificial knee joint: Secondary | ICD-10-CM | POA: Diagnosis not present

## 2022-01-14 DIAGNOSIS — E039 Hypothyroidism, unspecified: Secondary | ICD-10-CM | POA: Insufficient documentation

## 2022-01-14 DIAGNOSIS — E119 Type 2 diabetes mellitus without complications: Secondary | ICD-10-CM | POA: Insufficient documentation

## 2022-01-14 DIAGNOSIS — Z96659 Presence of unspecified artificial knee joint: Secondary | ICD-10-CM

## 2022-01-14 DIAGNOSIS — Z79899 Other long term (current) drug therapy: Secondary | ICD-10-CM | POA: Diagnosis not present

## 2022-01-14 DIAGNOSIS — M1712 Unilateral primary osteoarthritis, left knee: Principal | ICD-10-CM | POA: Insufficient documentation

## 2022-01-14 DIAGNOSIS — Z471 Aftercare following joint replacement surgery: Secondary | ICD-10-CM | POA: Diagnosis not present

## 2022-01-14 DIAGNOSIS — E118 Type 2 diabetes mellitus with unspecified complications: Secondary | ICD-10-CM

## 2022-01-14 DIAGNOSIS — Z96652 Presence of left artificial knee joint: Secondary | ICD-10-CM | POA: Insufficient documentation

## 2022-01-14 DIAGNOSIS — M25462 Effusion, left knee: Secondary | ICD-10-CM | POA: Diagnosis not present

## 2022-01-14 HISTORY — PX: KNEE ARTHROPLASTY: SHX992

## 2022-01-14 LAB — GLUCOSE, CAPILLARY
Glucose-Capillary: 129 mg/dL — ABNORMAL HIGH (ref 70–99)
Glucose-Capillary: 141 mg/dL — ABNORMAL HIGH (ref 70–99)
Glucose-Capillary: 134 mg/dL — ABNORMAL HIGH (ref 70–99)

## 2022-01-14 SURGERY — ARTHROPLASTY, KNEE, TOTAL, USING IMAGELESS COMPUTER-ASSISTED NAVIGATION
Anesthesia: Spinal | Site: Knee | Laterality: Left

## 2022-01-14 MED ORDER — TRANEXAMIC ACID-NACL 1000-0.7 MG/100ML-% IV SOLN
1000.0000 mg | Freq: Once | INTRAVENOUS | Status: AC
  Administered 2022-01-14: 1000 mg via INTRAVENOUS

## 2022-01-14 MED ORDER — FERROUS SULFATE 325 (65 FE) MG PO TABS
325.0000 mg | ORAL_TABLET | Freq: Two times a day (BID) | ORAL | Status: DC
  Administered 2022-01-14 – 2022-01-15 (×2): 325 mg via ORAL
  Filled 2022-01-14 (×2): qty 1

## 2022-01-14 MED ORDER — ONDANSETRON HCL 4 MG/2ML IJ SOLN: 4.0000 mg | Freq: Four times a day (QID) | INTRAMUSCULAR | Status: DC | PRN

## 2022-01-14 MED ORDER — HYDROMORPHONE HCL 1 MG/ML IJ SOLN
0.5000 mg | INTRAMUSCULAR | Status: DC | PRN
  Administered 2022-01-14: 1 mg via INTRAVENOUS
  Filled 2022-01-14: qty 1

## 2022-01-14 MED ORDER — CEFAZOLIN SODIUM-DEXTROSE 2-4 GM/100ML-% IV SOLN
INTRAVENOUS | Status: AC
  Filled 2022-01-14: qty 100

## 2022-01-14 MED ORDER — OXYCODONE HCL 5 MG PO TABS
10.0000 mg | ORAL_TABLET | ORAL | Status: DC | PRN
  Administered 2022-01-14 – 2022-01-15 (×5): 10 mg via ORAL
  Filled 2022-01-14 (×5): qty 2

## 2022-01-14 MED ORDER — DEXAMETHASONE SODIUM PHOSPHATE 10 MG/ML IJ SOLN
INTRAMUSCULAR | Status: AC
  Administered 2022-01-14: 8 mg via INTRAVENOUS
  Filled 2022-01-14: qty 1

## 2022-01-14 MED ORDER — DEXAMETHASONE SODIUM PHOSPHATE 10 MG/ML IJ SOLN: 8.0000 mg | Freq: Once | INTRAMUSCULAR | Status: AC

## 2022-01-14 MED ORDER — METFORMIN HCL 500 MG PO TABS: 500.0000 mg | ORAL_TABLET | Freq: Every day | ORAL | Status: DC

## 2022-01-14 MED ORDER — BUPIVACAINE HCL (PF) 0.5 % IJ SOLN
INTRAMUSCULAR | Status: DC | PRN
  Administered 2022-01-14: 3 mL

## 2022-01-14 MED ORDER — METOPROLOL TARTRATE 50 MG PO TABS
50.0000 mg | ORAL_TABLET | Freq: Every day | ORAL | Status: DC
  Administered 2022-01-15: 50 mg via ORAL
  Filled 2022-01-14: qty 1

## 2022-01-14 MED ORDER — ENOXAPARIN SODIUM 30 MG/0.3ML IJ SOSY
30.0000 mg | PREFILLED_SYRINGE | Freq: Two times a day (BID) | INTRAMUSCULAR | Status: DC
  Administered 2022-01-15: 30 mg via SUBCUTANEOUS
  Filled 2022-01-14 (×2): qty 0.3

## 2022-01-14 MED ORDER — BISACODYL 10 MG RE SUPP: 10.0000 mg | Freq: Every day | RECTAL | Status: DC | PRN

## 2022-01-14 MED ORDER — PROPOFOL 1000 MG/100ML IV EMUL
INTRAVENOUS | Status: AC
  Filled 2022-01-14: qty 100

## 2022-01-14 MED ORDER — CEFAZOLIN SODIUM-DEXTROSE 2-4 GM/100ML-% IV SOLN
2.0000 g | INTRAVENOUS | Status: AC
  Administered 2022-01-14: 2 g via INTRAVENOUS

## 2022-01-14 MED ORDER — TRANEXAMIC ACID-NACL 1000-0.7 MG/100ML-% IV SOLN
INTRAVENOUS | Status: AC
  Filled 2022-01-14: qty 100

## 2022-01-14 MED ORDER — IRRISEPT - 450ML BOTTLE WITH 0.05% CHG IN STERILE WATER, USP 99.95% OPTIME
TOPICAL | Status: DC | PRN
  Administered 2022-01-14: 450 mL

## 2022-01-14 MED ORDER — MIDAZOLAM HCL 5 MG/5ML IJ SOLN
INTRAMUSCULAR | Status: DC | PRN
  Administered 2022-01-14: 2 mg via INTRAVENOUS

## 2022-01-14 MED ORDER — INSULIN ASPART 100 UNIT/ML IJ SOLN
0.0000 [IU] | Freq: Three times a day (TID) | INTRAMUSCULAR | Status: DC
  Administered 2022-01-15 (×2): 2 [IU] via SUBCUTANEOUS
  Filled 2022-01-14 (×2): qty 1

## 2022-01-14 MED ORDER — FENTANYL CITRATE (PF) 100 MCG/2ML IJ SOLN: 25.0000 ug | INTRAMUSCULAR | Status: DC | PRN

## 2022-01-14 MED ORDER — VANCOMYCIN HCL IN DEXTROSE 1-5 GM/200ML-% IV SOLN
INTRAVENOUS | Status: AC
  Filled 2022-01-14: qty 200

## 2022-01-14 MED ORDER — ACETAMINOPHEN 10 MG/ML IV SOLN
INTRAVENOUS | Status: AC
  Filled 2022-01-14: qty 100

## 2022-01-14 MED ORDER — MENTHOL 3 MG MT LOZG: 1.0000 | LOZENGE | OROMUCOSAL | Status: DC | PRN

## 2022-01-14 MED ORDER — GABAPENTIN 300 MG PO CAPS: 300.0000 mg | ORAL_CAPSULE | Freq: Once | ORAL | Status: AC

## 2022-01-14 MED ORDER — ORAL CARE MOUTH RINSE: 15.0000 mL | Freq: Once | OROMUCOSAL | Status: AC

## 2022-01-14 MED ORDER — ONDANSETRON HCL 4 MG/2ML IJ SOLN: 4.0000 mg | Freq: Once | INTRAMUSCULAR | Status: DC | PRN

## 2022-01-14 MED ORDER — SODIUM CHLORIDE 0.9 % IR SOLN
Status: DC | PRN
  Administered 2022-01-14: 3000 mL

## 2022-01-14 MED ORDER — CITALOPRAM HYDROBROMIDE 10 MG PO TABS
20.0000 mg | ORAL_TABLET | Freq: Every day | ORAL | Status: DC
  Administered 2022-01-15: 20 mg via ORAL
  Filled 2022-01-14: qty 2

## 2022-01-14 MED ORDER — SENNOSIDES-DOCUSATE SODIUM 8.6-50 MG PO TABS
1.0000 | ORAL_TABLET | Freq: Two times a day (BID) | ORAL | Status: DC
  Administered 2022-01-14 – 2022-01-15 (×2): 1 via ORAL
  Filled 2022-01-14 (×2): qty 1

## 2022-01-14 MED ORDER — SODIUM CHLORIDE 0.9 % IV SOLN: INTRAVENOUS | Status: DC

## 2022-01-14 MED ORDER — VANCOMYCIN HCL IN DEXTROSE 1-5 GM/200ML-% IV SOLN: 1000.0000 mg | Freq: Once | INTRAVENOUS | Status: DC

## 2022-01-14 MED ORDER — VANCOMYCIN HCL IN DEXTROSE 1-5 GM/200ML-% IV SOLN
1000.0000 mg | Freq: Once | INTRAVENOUS | Status: AC
  Administered 2022-01-14: 1000 mg via INTRAVENOUS

## 2022-01-14 MED ORDER — MIDAZOLAM HCL 2 MG/2ML IJ SOLN
INTRAMUSCULAR | Status: AC
  Filled 2022-01-14: qty 2

## 2022-01-14 MED ORDER — FLEET ENEMA 7-19 GM/118ML RE ENEM: 1.0000 | ENEMA | Freq: Once | RECTAL | Status: DC | PRN

## 2022-01-14 MED ORDER — CELECOXIB 200 MG PO CAPS: 400.0000 mg | ORAL_CAPSULE | Freq: Once | ORAL | Status: AC

## 2022-01-14 MED ORDER — DIPHENHYDRAMINE HCL 12.5 MG/5ML PO ELIX
12.5000 mg | ORAL_SOLUTION | ORAL | Status: DC | PRN
  Administered 2022-01-14 – 2022-01-15 (×3): 25 mg via ORAL
  Filled 2022-01-14 (×3): qty 10

## 2022-01-14 MED ORDER — MAGNESIUM HYDROXIDE 400 MG/5ML PO SUSP
30.0000 mL | Freq: Every day | ORAL | Status: DC
  Administered 2022-01-14 – 2022-01-15 (×2): 30 mL via ORAL
  Filled 2022-01-14 (×2): qty 30

## 2022-01-14 MED ORDER — CELECOXIB 200 MG PO CAPS
ORAL_CAPSULE | ORAL | Status: AC
  Administered 2022-01-14: 400 mg via ORAL
  Filled 2022-01-14: qty 2

## 2022-01-14 MED ORDER — CHLORHEXIDINE GLUCONATE 4 % EX LIQD
60.0000 mL | Freq: Once | CUTANEOUS | Status: AC
  Administered 2022-01-14: 4 via TOPICAL

## 2022-01-14 MED ORDER — CHLORHEXIDINE GLUCONATE 0.12 % MT SOLN: 15.0000 mL | Freq: Once | OROMUCOSAL | Status: AC

## 2022-01-14 MED ORDER — ALUM & MAG HYDROXIDE-SIMETH 200-200-20 MG/5ML PO SUSP: 30.0000 mL | ORAL | Status: DC | PRN

## 2022-01-14 MED ORDER — FAMOTIDINE 20 MG PO TABS: 20.0000 mg | ORAL_TABLET | Freq: Once | ORAL | Status: AC

## 2022-01-14 MED ORDER — SODIUM CHLORIDE (PF) 0.9 % IJ SOLN
INTRAMUSCULAR | Status: DC | PRN
  Administered 2022-01-14: 120 mL via INTRAMUSCULAR

## 2022-01-14 MED ORDER — TRANEXAMIC ACID-NACL 1000-0.7 MG/100ML-% IV SOLN
1000.0000 mg | INTRAVENOUS | Status: AC
  Administered 2022-01-14: 1000 mg via INTRAVENOUS

## 2022-01-14 MED ORDER — TRAMADOL HCL 50 MG PO TABS: 50.0000 mg | ORAL_TABLET | ORAL | Status: DC | PRN

## 2022-01-14 MED ORDER — PANTOPRAZOLE SODIUM 40 MG PO TBEC
40.0000 mg | DELAYED_RELEASE_TABLET | Freq: Two times a day (BID) | ORAL | Status: DC
  Administered 2022-01-14 – 2022-01-15 (×2): 40 mg via ORAL
  Filled 2022-01-14 (×2): qty 1

## 2022-01-14 MED ORDER — GABAPENTIN 300 MG PO CAPS
ORAL_CAPSULE | ORAL | Status: AC
  Administered 2022-01-14: 300 mg via ORAL
  Filled 2022-01-14: qty 1

## 2022-01-14 MED ORDER — OXYCODONE HCL 5 MG PO TABS: 5.0000 mg | ORAL_TABLET | ORAL | Status: DC | PRN

## 2022-01-14 MED ORDER — CEFAZOLIN SODIUM-DEXTROSE 2-4 GM/100ML-% IV SOLN
2.0000 g | Freq: Three times a day (TID) | INTRAVENOUS | Status: AC
  Administered 2022-01-14 – 2022-01-15 (×2): 2 g via INTRAVENOUS
  Filled 2022-01-14 (×2): qty 100

## 2022-01-14 MED ORDER — ACETAMINOPHEN 10 MG/ML IV SOLN
INTRAVENOUS | Status: DC | PRN
  Administered 2022-01-14: 1000 mg via INTRAVENOUS

## 2022-01-14 MED ORDER — ONDANSETRON HCL 4 MG PO TABS: 4.0000 mg | ORAL_TABLET | Freq: Four times a day (QID) | ORAL | Status: DC | PRN

## 2022-01-14 MED ORDER — CELECOXIB 200 MG PO CAPS
200.0000 mg | ORAL_CAPSULE | Freq: Two times a day (BID) | ORAL | Status: DC
  Administered 2022-01-14 – 2022-01-15 (×2): 200 mg via ORAL
  Filled 2022-01-14 (×2): qty 1

## 2022-01-14 MED ORDER — INSULIN ASPART 100 UNIT/ML IJ SOLN: 0.0000 [IU] | Freq: Every day | INTRAMUSCULAR | Status: DC

## 2022-01-14 MED ORDER — METOCLOPRAMIDE HCL 5 MG PO TABS
10.0000 mg | ORAL_TABLET | Freq: Three times a day (TID) | ORAL | Status: DC
  Administered 2022-01-14 – 2022-01-15 (×2): 10 mg via ORAL
  Filled 2022-01-14 (×2): qty 2

## 2022-01-14 MED ORDER — PHENOL 1.4 % MT LIQD: 1.0000 | OROMUCOSAL | Status: DC | PRN

## 2022-01-14 MED ORDER — ACETAMINOPHEN 10 MG/ML IV SOLN
1000.0000 mg | Freq: Four times a day (QID) | INTRAVENOUS | Status: DC
  Administered 2022-01-14 – 2022-01-15 (×2): 1000 mg via INTRAVENOUS
  Filled 2022-01-14 (×3): qty 100

## 2022-01-14 MED ORDER — FAMOTIDINE 20 MG PO TABS
ORAL_TABLET | ORAL | Status: AC
  Administered 2022-01-14: 20 mg via ORAL
  Filled 2022-01-14: qty 1

## 2022-01-14 MED ORDER — PROPOFOL 500 MG/50ML IV EMUL
INTRAVENOUS | Status: DC | PRN
  Administered 2022-01-14: 50 ug/kg/min via INTRAVENOUS

## 2022-01-14 MED ORDER — ACETAMINOPHEN 325 MG PO TABS: 325.0000 mg | ORAL_TABLET | Freq: Four times a day (QID) | ORAL | Status: DC | PRN

## 2022-01-14 MED ORDER — CHLORHEXIDINE GLUCONATE 0.12 % MT SOLN
OROMUCOSAL | Status: AC
  Administered 2022-01-14: 15 mL via OROMUCOSAL
  Filled 2022-01-14: qty 15

## 2022-01-14 MED ORDER — LEVOTHYROXINE SODIUM 50 MCG PO TABS
125.0000 ug | ORAL_TABLET | Freq: Every day | ORAL | Status: DC
  Administered 2022-01-15: 125 ug via ORAL
  Filled 2022-01-14: qty 1

## 2022-01-14 SURGICAL SUPPLY — 78 items
ATTUNE MED DOME PAT 38 KNEE (Knees) IMPLANT
ATTUNE PS FEM LT SZ 4 CEM KNEE (Femur) IMPLANT
ATTUNE PSRP INSR SZ4 5 KNEE (Insert) IMPLANT
BASE TIBIAL ROT PLAT SZ 5 KNEE (Knees) IMPLANT
BATTERY INSTRU NAVIGATION (MISCELLANEOUS) ×4 IMPLANT
BLADE CLIPPER SURG (BLADE) IMPLANT
BLADE SAW 70X12.5 (BLADE) ×1 IMPLANT
BLADE SAW 90X13X1.19 OSCILLAT (BLADE) ×1 IMPLANT
BLADE SAW 90X25X1.19 OSCILLAT (BLADE) ×1 IMPLANT
BONE CEMENT GENTAMICIN (Cement) ×2 IMPLANT
BSPLAT TIB 5 CMNT ROT PLAT STR (Knees) ×1 IMPLANT
BTRY SRG DRVR LF (MISCELLANEOUS) ×4
CEMENT BONE GENTAMICIN 40 (Cement) IMPLANT
COOLER POLAR GLACIER W/PUMP (MISCELLANEOUS) ×1 IMPLANT
CUFF TOURN SGL QUICK 24 (TOURNIQUET CUFF)
CUFF TOURN SGL QUICK 34 (TOURNIQUET CUFF)
CUFF TRNQT CYL 24X4X16.5-23 (TOURNIQUET CUFF) IMPLANT
CUFF TRNQT CYL 34X4.125X (TOURNIQUET CUFF) IMPLANT
DRAPE 3/4 80X56 (DRAPES) ×1 IMPLANT
DRAPE INCISE IOBAN 66X45 STRL (DRAPES) IMPLANT
DRSG MEPILEX SACRM 8.7X9.8 (GAUZE/BANDAGES/DRESSINGS) ×1 IMPLANT
DRSG NON-ADHERENT DERMACEA 3X4 (GAUZE/BANDAGES/DRESSINGS) ×1 IMPLANT
DRSG OPSITE POSTOP 4X14 (GAUZE/BANDAGES/DRESSINGS) ×1 IMPLANT
DRSG TEGADERM 4X4.75 (GAUZE/BANDAGES/DRESSINGS) ×1 IMPLANT
DURAPREP 26ML APPLICATOR (WOUND CARE) ×2 IMPLANT
ELECT CAUTERY BLADE 6.4 (BLADE) ×1 IMPLANT
ELECT REM PT RETURN 9FT ADLT (ELECTROSURGICAL) ×1
ELECTRODE REM PT RTRN 9FT ADLT (ELECTROSURGICAL) ×1 IMPLANT
EX-PIN ORTHOLOCK NAV 4X150 (PIN) ×2 IMPLANT
GLOVE BIOGEL M STRL SZ7.5 (GLOVE) ×2 IMPLANT
GLOVE BIOGEL PI IND STRL 7.5 (GLOVE) ×1 IMPLANT
GLOVE PI ORTHO PRO STRL 7.5 (GLOVE) ×2 IMPLANT
GLOVE SRG 8 PF TXTR STRL LF DI (GLOVE) ×1 IMPLANT
GLOVE SURG UNDER POLY LF SZ7.5 (GLOVE) ×1 IMPLANT
GLOVE SURG UNDER POLY LF SZ8 (GLOVE) ×1
GOWN STRL REUS W/ TWL LRG LVL3 (GOWN DISPOSABLE) ×1 IMPLANT
GOWN STRL REUS W/ TWL XL LVL3 (GOWN DISPOSABLE) ×1 IMPLANT
GOWN STRL REUS W/TWL LRG LVL3 (GOWN DISPOSABLE) ×1
GOWN STRL REUS W/TWL XL LVL3 (GOWN DISPOSABLE) ×1
GOWN TOGA ZIPPER T7+ PEEL AWAY (MISCELLANEOUS) ×2 IMPLANT
HEMOVAC 400CC 10FR (MISCELLANEOUS) ×1 IMPLANT
HOLDER FOLEY CATH W/STRAP (MISCELLANEOUS) ×1 IMPLANT
IV NS IRRIG 3000ML ARTHROMATIC (IV SOLUTION) ×1 IMPLANT
JET LAVAGE IRRISEPT WOUND (IRRIGATION / IRRIGATOR) ×1
KIT TURNOVER KIT A (KITS) ×1 IMPLANT
KNIFE SCULPS 14X20 (INSTRUMENTS) ×1 IMPLANT
LAVAGE JET IRRISEPT WOUND (IRRIGATION / IRRIGATOR) IMPLANT
MANIFOLD NEPTUNE II (INSTRUMENTS) ×2 IMPLANT
NDL SPNL 20GX3.5 QUINCKE YW (NEEDLE) ×2 IMPLANT
NEEDLE SPNL 20GX3.5 QUINCKE YW (NEEDLE) ×2 IMPLANT
NS IRRIG 500ML POUR BTL (IV SOLUTION) ×1 IMPLANT
PACK TOTAL KNEE (MISCELLANEOUS) ×1 IMPLANT
PAD ABD DERMACEA PRESS 5X9 (GAUZE/BANDAGES/DRESSINGS) ×2 IMPLANT
PAD WRAPON POLAR KNEE (MISCELLANEOUS) ×1 IMPLANT
PIN DRILL FIX HALF THREAD (BIT) ×2 IMPLANT
PIN FIXATION 1/8DIA X 3INL (PIN) ×1 IMPLANT
PULSAVAC PLUS IRRIG FAN TIP (DISPOSABLE) ×1
SOL PREP PVP 2OZ (MISCELLANEOUS) ×1
SOLUTION IRRIG SURGIPHOR (IV SOLUTION) ×1 IMPLANT
SOLUTION PREP PVP 2OZ (MISCELLANEOUS) ×1 IMPLANT
SPONGE DRAIN TRACH 4X4 STRL 2S (GAUZE/BANDAGES/DRESSINGS) ×1 IMPLANT
STAPLER SKIN PROX 35W (STAPLE) ×1 IMPLANT
STOCKINETTE IMPERV 14X48 (MISCELLANEOUS) ×1 IMPLANT
STRAP TIBIA SHORT (MISCELLANEOUS) ×1 IMPLANT
SUCTION FRAZIER HANDLE 10FR (MISCELLANEOUS) ×1
SUCTION TUBE FRAZIER 10FR DISP (MISCELLANEOUS) ×1 IMPLANT
SUT VIC AB 0 CT1 36 (SUTURE) ×1 IMPLANT
SUT VIC AB 1 CT1 36 (SUTURE) ×2 IMPLANT
SUT VIC AB 2-0 CT2 27 (SUTURE) ×1 IMPLANT
SYR 30ML LL (SYRINGE) ×2 IMPLANT
TIBIAL BASE ROT PLAT SZ 5 KNEE (Knees) ×1 IMPLANT
TIP FAN IRRIG PULSAVAC PLUS (DISPOSABLE) ×1 IMPLANT
TOWEL OR 17X26 4PK STRL BLUE (TOWEL DISPOSABLE) IMPLANT
TOWER CARTRIDGE SMART MIX (DISPOSABLE) ×1 IMPLANT
TRAP FLUID SMOKE EVACUATOR (MISCELLANEOUS) ×1 IMPLANT
TRAY FOLEY MTR SLVR 16FR STAT (SET/KITS/TRAYS/PACK) ×1 IMPLANT
WATER STERILE IRR 1000ML POUR (IV SOLUTION) ×1 IMPLANT
WRAPON POLAR PAD KNEE (MISCELLANEOUS) ×1

## 2022-01-14 NOTE — Transfer of Care (Signed)
Immediate Anesthesia Transfer of Care Note  Patient: Carrie Pham  Procedure(s) Performed: COMPUTER ASSISTED TOTAL KNEE ARTHROPLASTY - RNFA (Left: Knee)  Patient Location: PACU  Anesthesia Type:General  Level of Consciousness: awake, alert , and oriented  Airway & Oxygen Therapy: Patient Spontanous Breathing and Patient connected to nasal cannula oxygen  Post-op Assessment: Report given to RN and Post -op Vital signs reviewed and stable  Post vital signs: Reviewed and stable  Last Vitals:  Vitals Value Taken Time  BP 111/61 01/14/22 1515  Temp    Pulse 89 01/14/22 1517  Resp 20 01/14/22 1517  SpO2 96 % 01/14/22 1517  Vitals shown include unvalidated device data.  Last Pain:  Vitals:   01/14/22 0936  TempSrc: Temporal  PainSc: 5          Complications: No notable events documented.

## 2022-01-14 NOTE — Progress Notes (Signed)
PT Cancellation Note  Patient Details Name: Carrie Pham MRN: [id number] DOB: [birth year]   Cancelled Treatment:    Reason Eval/Treat Not Completed: Other (comment): Per nursing pt with inability to move LE's at time of PT eval attempt this PM and not appropriate for PT services.  Will attempt to see pt at a future date/time as medically appropriate.     Ovidio Hanger PT, DPT 01/14/22, 4:59 PM

## 2022-01-14 NOTE — Interval H&P Note (Signed)
History and Physical Interval Note:  01/14/2022 11:09 AM  Carrie Pham  has presented today for surgery, with the diagnosis of PRIMARY OSTEOARTHRITIS OF LEFT KNEE..  The various methods of treatment have been discussed with the patient and family. After consideration of risks, benefits and other options for treatment, the patient has consented to  Procedure(s): COMPUTER ASSISTED TOTAL KNEE ARTHROPLASTY - RNFA (Left) as a surgical intervention.  The patient's history has been reviewed, patient examined, no change in status, stable for surgery.  I have reviewed the patient's chart and labs.  Questions were answered to the patient's satisfaction.     Miyuki Rzasa P Draycen Leichter

## 2022-01-14 NOTE — Anesthesia Preprocedure Evaluation (Addendum)
Anesthesia Evaluation  Patient identified by MRN, date of birth, ID band Patient awake    Reviewed: Allergy & Precautions, H&P , NPO status , Patient's Chart, lab work & pertinent test results, reviewed documented beta blocker date and time   Airway Mallampati: II   Neck ROM: full    Dental  (+) Poor Dentition   Pulmonary neg pulmonary ROS   Pulmonary exam normal        Cardiovascular Exercise Tolerance: Poor hypertension, On Medications Normal cardiovascular exam Rhythm:regular Rate:Normal     Neuro/Psych  PSYCHIATRIC DISORDERS  Depression     Neuromuscular disease    GI/Hepatic negative GI ROS, Neg liver ROS,,,  Endo/Other  Hypothyroidism    Renal/GU negative Renal ROS  negative genitourinary   Musculoskeletal   Abdominal   Peds  Hematology  (+) Blood dyscrasia, anemia   Anesthesia Other Findings Past Medical History: No date: Arthritis No date: Fibromyalgia No date: History of chicken pox No date: Hypertension No date: Hypothyroidism No date: Impaired fasting blood sugar 10/20/2021: Nose colonized with MRSA     Comment:  a.) noted on presurgical PCR prior to RIGHT TKA No date: Pre-diabetes Past Surgical History: No date: ABDOMINAL HYSTERECTOMY 10/2019, 12/2019: CATARACT EXTRACTION, BILATERAL; Bilateral No date: CESAREAN SECTION     Comment:  x2 No date: EYE SURGERY 10/27/2021: KNEE ARTHROPLASTY; Right     Comment:  Procedure: COMPUTER ASSISTED TOTAL KNEE ARTHROPLASTY;                Surgeon: Donato Heinz, MD;  Location: [facility] ORS;                Service: Orthopedics;  Laterality: Right; 1995: PARTIAL HYSTERECTOMY     Comment:  benign, heavy bleeding, ovaries remain ([place]) BMI    Body Mass Index: 29.29 kg/m     Reproductive/Obstetrics negative OB ROS                             Anesthesia Physical Anesthesia Plan  ASA: 3  Anesthesia Plan: Spinal   Post-op Pain  Management:    Induction:   PONV Risk Score and Plan: 3  Airway Management Planned:   Additional Equipment:   Intra-op Plan:   Post-operative Plan:   Informed Consent: I have reviewed the patients History and Physical, chart, labs and discussed the procedure including the risks, benefits and alternatives for the proposed anesthesia with the patient or authorized representative who has indicated his/her understanding and acceptance.     Dental Advisory Given  Plan Discussed with: CRNA  Anesthesia Plan Comments:        Anesthesia Quick Evaluation

## 2022-01-14 NOTE — Anesthesia Procedure Notes (Signed)
Spinal  Patient location during procedure: OR Start time: 01/14/2022 11:50 AM End time: 01/14/2022 12:01 PM Reason for block: surgical anesthesia Staffing Performed: resident/CRNA  Anesthesiologist: Yevette Edwards, MD Resident/CRNA: Berniece Pap, CRNA Performed by: Berniece Pap, CRNA Authorized by: Yevette Edwards, MD   Preanesthetic Checklist Completed: patient identified, IV checked, site marked, risks and benefits discussed, surgical consent, monitors and equipment checked, pre-op evaluation and timeout performed Spinal Block Patient position: sitting Prep: DuraPrep Patient monitoring: heart rate, cardiac monitor, continuous pulse ox and blood pressure Approach: midline Location: L3-4 Injection technique: single-shot Needle Needle type: Sprotte  Needle gauge: 24 G Needle length: 9 cm Assessment Sensory level: T4 Events: CSF return

## 2022-01-14 NOTE — Op Note (Signed)
OPERATIVE NOTE  DATE OF SURGERY:  01/14/2022  PATIENT NAME:  Carrie Pham   DOB: [birth year]  MRN: [id number]  PRE-OPERATIVE DIAGNOSIS: Degenerative arthrosis of the left knee, primary  POST-OPERATIVE DIAGNOSIS:  Same  PROCEDURE:  Left total knee arthroplasty using computer-assisted navigation  SURGEON:  Jena Gauss. M.D.  ANESTHESIA: spinal  ESTIMATED BLOOD LOSS: 50 mL  FLUIDS REPLACED: 1000 mL of crystalloid  TOURNIQUET TIME: 77 minutes  DRAINS: 2 medium Hemovac drains  SOFT TISSUE RELEASES: Anterior cruciate ligament, posterior cruciate ligament, deep medial collateral ligament, patellofemoral ligament  IMPLANTS UTILIZED: DePuy Attune size 4 posterior stabilized femoral component (cemented), size 5 rotating platform tibial component (cemented), 38 mm medialized dome patella (cemented), and a 5 mm stabilized rotating platform polyethylene insert.  INDICATIONS FOR SURGERY: [name] is a 61 y.o. year old female with a long history of progressive knee pain. X-rays demonstrated severe degenerative changes in tricompartmental fashion. The patient had not seen any significant improvement despite conservative nonsurgical intervention. After discussion of the risks and benefits of surgical intervention, the patient expressed understanding of the risks benefits and agree with plans for total knee arthroplasty.   The risks, benefits, and alternatives were discussed at length including but not limited to the risks of infection, bleeding, nerve injury, stiffness, blood clots, the need for revision surgery, cardiopulmonary complications, among others, and they were willing to proceed.  PROCEDURE IN DETAIL: The patient was brought into the operating room and, after adequate spinal anesthesia was achieved, a tourniquet was placed on the patient's upper thigh. The patient's knee and leg were cleaned and prepped with alcohol and DuraPrep and draped in the usual sterile fashion. A  "timeout" was performed as per usual protocol. The lower extremity was exsanguinated using an Esmarch, and the tourniquet was inflated to 300 mmHg. An anterior longitudinal incision was made followed by a standard mid vastus approach. The deep fibers of the medial collateral ligament were elevated in a subperiosteal fashion off of the medial flare of the tibia so as to maintain a continuous soft tissue sleeve. The patella was subluxed laterally and the patellofemoral ligament was incised. Inspection of the knee demonstrated severe degenerative changes with full-thickness loss of articular cartilage. Osteophytes were debrided using a rongeur. Anterior and posterior cruciate ligaments were excised. Two 4.0 mm Schanz pins were inserted in the femur and into the tibia for attachment of the array of trackers used for computer-assisted navigation. Hip center was identified using a circumduction technique. Distal landmarks were mapped using the computer. The distal femur and proximal tibia were mapped using the computer. The distal femoral cutting guide was positioned using computer-assisted navigation so as to achieve a 5 distal valgus cut. The femur was sized and it was felt that a size 4 femoral component was appropriate. A size 4 femoral cutting guide was positioned and the anterior cut was performed and verified using the computer. This was followed by completion of the posterior and chamfer cuts. Femoral cutting guide for the central box was then positioned in the center box cut was performed.  Attention was then directed to the proximal tibia. Medial and lateral menisci were excised. The extramedullary tibial cutting guide was positioned using computer-assisted navigation so as to achieve a 0 varus-valgus alignment and 3 posterior slope. The cut was performed and verified using the computer. The proximal tibia was sized and it was felt that a size 5 tibial tray was appropriate. Tibial and femoral trials were  inserted followed  by insertion of a 5 mm polyethylene insert. This allowed for excellent mediolateral soft tissue balancing both in flexion and in full extension. Finally, the patella was cut and prepared so as to accommodate a 38 mm medialized dome patella. A patella trial was placed and the knee was placed through a range of motion with excellent patellar tracking appreciated. The femoral trial was removed after debridement of posterior osteophytes. The central post-hole for the tibial component was reamed followed by insertion of a keel punch. Tibial trials were then removed. Cut surfaces of bone were irrigated with copious amounts of normal saline using pulsatile lavage and then suctioned dry. Polymethylmethacrylate cement with gentamicin was prepared in the usual fashion using a vacuum mixer. Cement was applied to the cut surface of the proximal tibia as well as along the undersurface of a size 5 rotating platform tibial component. Tibial component was positioned and impacted into place. Excess cement was removed using Personal assistant. Cement was then applied to the cut surfaces of the femur as well as along the posterior flanges of the size 4 femoral component. The femoral component was positioned and impacted into place. Excess cement was removed using Personal assistant. A 5 mm polyethylene trial was inserted and the knee was brought into full extension with steady axial compression applied. Finally, cement was applied to the backside of a 38 mm medialized dome patella and the patellar component was positioned and patellar clamp applied. Excess cement was removed using Personal assistant. After adequate curing of the cement, the tourniquet was deflated after a total tourniquet time of 77 minutes. Hemostasis was achieved using electrocautery. The knee was irrigated with copious amounts of normal saline using pulsatile lavage followed by 450 ml of Surgiphor and then suctioned dry. 20 mL of 1.3% Exparel and 60 mL of  0.25% Marcaine in 40 mL of normal saline was injected along the posterior capsule, medial and lateral gutters, and along the arthrotomy site. A 5 mm stabilized rotating platform polyethylene insert was inserted and the knee was placed through a range of motion with excellent mediolateral soft tissue balancing appreciated and excellent patellar tracking noted. 2 medium drains were placed in the wound bed and brought out through separate stab incisions. The medial parapatellar portion of the incision was reapproximated using interrupted sutures of #1 Vicryl. Subcutaneous tissue was approximated in layers using first #0 Vicryl followed #2-0 Vicryl. The skin was approximated with skin staples. A sterile dressing was applied.  The patient tolerated the procedure well and was transported to the recovery room in stable condition.    Tarissa Kerin P. Angie Fava., M.D.

## 2022-01-15 DIAGNOSIS — M1712 Unilateral primary osteoarthritis, left knee: Secondary | ICD-10-CM | POA: Diagnosis not present

## 2022-01-15 DIAGNOSIS — Z79899 Other long term (current) drug therapy: Secondary | ICD-10-CM | POA: Diagnosis not present

## 2022-01-15 DIAGNOSIS — Z96659 Presence of unspecified artificial knee joint: Secondary | ICD-10-CM | POA: Diagnosis not present

## 2022-01-15 DIAGNOSIS — I10 Essential (primary) hypertension: Secondary | ICD-10-CM | POA: Diagnosis not present

## 2022-01-15 DIAGNOSIS — Z96651 Presence of right artificial knee joint: Secondary | ICD-10-CM | POA: Diagnosis not present

## 2022-01-15 DIAGNOSIS — E039 Hypothyroidism, unspecified: Secondary | ICD-10-CM | POA: Diagnosis not present

## 2022-01-15 DIAGNOSIS — Z7984 Long term (current) use of oral hypoglycemic drugs: Secondary | ICD-10-CM | POA: Diagnosis not present

## 2022-01-15 DIAGNOSIS — E119 Type 2 diabetes mellitus without complications: Secondary | ICD-10-CM | POA: Diagnosis not present

## 2022-01-15 LAB — GLUCOSE, CAPILLARY
Glucose-Capillary: 139 mg/dL — ABNORMAL HIGH (ref 70–99)
Glucose-Capillary: 138 mg/dL — ABNORMAL HIGH (ref 70–99)

## 2022-01-15 MED ORDER — TRAMADOL HCL 50 MG PO TABS: 50.0000 mg | ORAL_TABLET | ORAL | 0 refills | Status: DC | PRN

## 2022-01-15 MED ORDER — ENOXAPARIN SODIUM 40 MG/0.4ML IJ SOSY: 40.0000 mg | PREFILLED_SYRINGE | INTRAMUSCULAR | 0 refills | Status: DC

## 2022-01-15 MED ORDER — OXYCODONE HCL 5 MG PO TABS: 5.0000 mg | ORAL_TABLET | ORAL | 0 refills | Status: DC | PRN

## 2022-01-15 MED ORDER — CELECOXIB 200 MG PO CAPS: 200.0000 mg | ORAL_CAPSULE | Freq: Two times a day (BID) | ORAL | 0 refills | Status: DC

## 2022-01-15 NOTE — TOC CM/SW Note (Addendum)
Notified [facility] of DC home today. Patient was prearranged by [employer] with [facility]. Confirmed with RN that patient has a RW at home but needs a BSC. BSC referral made to [facility] with [employer] who is aware of DC home today.   Alfonso Ramus, [place] [phone]

## 2022-01-15 NOTE — Progress Notes (Addendum)
  Subjective: 1 Day Post-Op Procedure(s) (LRB): COMPUTER ASSISTED TOTAL KNEE ARTHROPLASTY - RNFA (Left) Patient reports pain as well-controlled.   Patient is well, and has had no acute complaints or problems Plan is to go Home after hospital stay. Negative for chest pain and shortness of breath Fever: no Gastrointestinal: negative for nausea and vomiting.  Patient has not had a bowel movement.  Objective: Vital signs in last 24 hours: Temp:  [97.4 F (36.3 C)-97.9 F (36.6 C)] 97.7 F (36.5 C) (12/30 0725) Pulse Rate:  [57-93] 57 (12/30 0725) Resp:  [13-18] 17 (12/30 0725) BP: (111-150)/(59-95) 125/64 (12/30 0725) SpO2:  [96 %-100 %] 97 % (12/30 0725) Weight:  [83.6 kg] 83.6 kg (12/29 0936)  Intake/Output from previous day:  Intake/Output Summary (Last 24 hours) at 01/15/2022 0851 Last data filed at 01/15/2022 0447 Gross per 24 hour  Intake 2692.08 ml  Output 1500 ml  Net 1192.08 ml    Intake/Output this shift: No intake/output data recorded.  Labs: No results for input(s): "HGB" in the last 72 hours. No results for input(s): "WBC", "RBC", "HCT", "PLT" in the last 72 hours. No results for input(s): "NA", "K", "CL", "CO2", "BUN", "CREATININE", "GLUCOSE", "CALCIUM" in the last 72 hours. No results for input(s): "LABPT", "INR" in the last 72 hours.   EXAM General - Patient is Alert, Appropriate, and Oriented Extremity - Neurovascular intact Dorsiflexion/Plantar flexion intact Compartment soft Dressing/Incision -Postoperative dressing remains in place., Polar Care in place but not running and with no water in cooler, Hemovac in place. , Following removal of post-op dressing, no significant drainage noted. Motor Function - intact, moving foot and toes well on exam. Able to perform SLR with assistance.   Cardiovascular- Regular rate and rhythm, no murmurs/rubs/gallops Respiratory- Lungs clear to auscultation bilaterally Gastrointestinal- soft, nontender, and active bowel  sounds   Assessment/Plan: 1 Day Post-Op Procedure(s) (LRB): COMPUTER ASSISTED TOTAL KNEE ARTHROPLASTY - RNFA (Left) Principal Problem:   Total knee replacement status  Estimated body mass index is 29.29 kg/m as calculated from the following:   Height as of this encounter: 5' 6.5" (1.689 m).   Weight as of this encounter: 83.6 kg. Advance diet Up with therapy  Anticipate d/c today pending completion of therapy goals. Spoke with Diplomatic Services operational officer on floor regarding getting a new power cord for the Polar Care unit as it is not working even after adding water.   Post-op dressing removed. , Hemovac removed., Mini compression dressing applied. , and Fresh honeycomb dressing applied.   DVT Prophylaxis - Lovenox, Ted hose, and SCDs Weight-Bearing as tolerated to left leg  Baldwin Jamaica, PA-C [facility] Orthopaedic Surgery 01/15/2022, 8:51 AM

## 2022-01-15 NOTE — Progress Notes (Signed)
Physical Therapy Treatment Patient Details Name: Carrie Pham MRN: [id number] DOB: 1960-07-17 Today's Date: 01/15/2022   History of Present Illness 61 y/o female s/p L TKA 01/14/22    PT Comments    Pt did well with PT POD1 showing ability to safety ambulate >200 ft, negotiate up/down steps, flexion ROM >90 and though she is dealing with post-op pain showed great effort and determination with PT session.  Pt had other knee replaced just a few months ago and feels confident with ability to manage at home with available assist, has necessary DME.  Safe to d/c home from PT stand-point, will benefit from continued PT per TKA protocol.   Recommendations for follow up therapy are one component of a multi-disciplinary discharge planning process, led by the attending physician.  Recommendations may be updated based on patient status, additional functional criteria and insurance authorization.  Follow Up Recommendations  Home health PT     Assistance Recommended at Discharge Intermittent Supervision/Assistance  Patient can return home with the following A little help with walking and/or transfers;A little help with bathing/dressing/bathroom;Assistance with cooking/housework;Assist for transportation;Help with stairs or ramp for entrance   Equipment Recommendations  None recommended by PT    Recommendations for Other Services       Precautions / Restrictions Precautions Precautions: Fall;Knee Precaution Booklet Issued: No Restrictions Weight Bearing Restrictions: Yes LLE Weight Bearing: Weight bearing as tolerated     Mobility  Bed Mobility Overal bed mobility: Modified Independent                  Transfers Overall transfer level: Modified independent Equipment used: Rolling walker (2 wheels) Transfers: Sit to/from Stand Sit to Stand: Supervision                Ambulation/Gait Ambulation/Gait assistance: Supervision Gait Distance (Feet): 250 Feet Assistive  device: Rolling walker (2 wheels)         General Gait Details: Pt with improving cadence and confidence with increased cuing and distance.  WBing hesitancy and walker use both improved with cuing and encouragement.   Stairs Stairs: Yes Stairs assistance: Supervision Stair Management: One rail Right, Sideways Number of Stairs: 8 General stair comments: up/down 4 steps x 2 w/o direct assist, better confidence and efficiency with second effort.   Wheelchair Mobility    Modified Rankin (Stroke Patients Only)       Balance Overall balance assessment: Needs assistance Sitting-balance support: Feet supported, No upper extremity supported Sitting balance-Leahy Scale: Good     Standing balance support: During functional activity, Single extremity supported Standing balance-Leahy Scale: Good                              Cognition Arousal/Alertness: Awake/alert Behavior During Therapy: WFL for tasks assessed/performed Overall Cognitive Status: Within Functional Limits for tasks assessed                                          Exercises Total Joint Exercises Ankle Circles/Pumps: AROM, 10 reps Quad Sets: Strengthening, 10 reps Heel Slides: Strengthening, 10 reps Straight Leg Raises: AROM, 10 reps Long Arc Quad: AROM, 10 reps Knee Flexion: PROM, 5 reps Goniometric ROM: 0-93    General Comments        Pertinent Vitals/Pain Pain Assessment Pain Score: 7  Pain Location: L knee Pain Descriptors /  Indicators: Discomfort, Guarding    Home Living Family/patient expects to be discharged to:: Private residence Living Arrangements: Spouse/significant other Available Help at Discharge: Family;Available PRN/intermittently Type of Home: House Home Access: Stairs to enter Entrance Stairs-Rails: Right Entrance Stairs-Number of Steps: 5   Home Layout: One level Home Equipment: Agricultural consultant (2 wheels);Rollator (4 wheels);BSC/3in1      Prior  Function            PT Goals (current goals can now be found in the care plan section) Acute Rehab PT Goals Patient Stated Goal: go home PT Goal Formulation: With patient Time For Goal Achievement: 01/28/22 Potential to Achieve Goals: Good    Frequency    BID      PT Plan      Co-evaluation              AM-PAC PT "6 Clicks" Mobility   Outcome Measure  Help needed turning from your back to your side while in a flat bed without using bedrails?: None Help needed moving from lying on your back to sitting on the side of a flat bed without using bedrails?: None Help needed moving to and from a bed to a chair (including a wheelchair)?: None Help needed standing up from a chair using your arms (e.g., wheelchair or bedside chair)?: None Help needed to walk in hospital room?: A Little Help needed climbing 3-5 steps with a railing? : A Little 6 Click Score: 22    End of Session Equipment Utilized During Treatment: Gait belt Activity Tolerance: Patient tolerated treatment well Patient left: with chair alarm set;with call bell/phone within reach Nurse Communication: Mobility status PT Visit Diagnosis: Muscle weakness (generalized) (M62.81);Difficulty in walking, not elsewhere classified (R26.2)     Time: 2956-2130 PT Time Calculation (min) (ACUTE ONLY): 32 min  Charges:  $Gait Training: 8-22 mins $Therapeutic Exercise: 8-22 mins                     Malachi Pro, DPT 01/15/2022, 10:51 AM

## 2022-01-15 NOTE — Evaluation (Signed)
Occupational Therapy Evaluation Patient Details Name: Carrie Pham MRN: [id number] DOB: [birth year] Today's Date: 01/15/2022   History of Present Illness 61 y/o female s/p L TKA 01/14/22   Clinical Impression   Carrie Pham was seen for OT evaluation this date, POD#1 from above surgery. Pt was independent in all ADLs prior to surgery, and reports recovering well from her recent R TKA in October of this year. Pt is eager to return to PLOF with less pain and improved safety and independence. Pt currently requires SUPERIVISION assist for LB dressing while in seated position due to pain and limited AROM of L knee. Pt instructed in polar care mgt, falls prevention strategies, home/routines modifications, DME/AE for LB bathing and dressing tasks, and compression stocking mgt. Pt would benefit from skilled OT services including additional instruction in dressing techniques with or without assistive devices for dressing and bathing skills to support recall and carryover prior to discharge and ultimately to maximize safety, independence, and minimize falls risk and caregiver burden. Do not currently anticipate any OT needs following this hospitalization.         Recommendations for follow up therapy are one component of a multi-disciplinary discharge planning process, led by the attending physician.  Recommendations may be updated based on patient status, additional functional criteria and insurance authorization.   Follow Up Recommendations  No OT follow up     Assistance Recommended at Discharge Set up Supervision/Assistance  Patient can return home with the following A little help with walking and/or transfers;A little help with bathing/dressing/bathroom;Assist for transportation;Help with stairs or ramp for entrance    Functional Status Assessment  Patient has had a recent decline in their functional status and demonstrates the ability to make significant improvements in function in a reasonable and  predictable amount of time.  Equipment Recommendations  None recommended by OT    Recommendations for Other Services       Precautions / Restrictions Precautions Precautions: Fall;Knee Precaution Booklet Issued: No Restrictions Weight Bearing Restrictions: Yes LLE Weight Bearing: Weight bearing as tolerated      Mobility Bed Mobility Overal bed mobility: Modified Independent             General bed mobility comments: HOB elevated    Transfers Overall transfer level: Needs assistance Equipment used: Rolling walker (2 wheels) Transfers: Sit to/from Stand Sit to Stand: Supervision           General transfer comment: Supervision for functional mobility in room.      Balance Overall balance assessment: Needs assistance Sitting-balance support: Feet supported, No upper extremity supported Sitting balance-Leahy Scale: Good     Standing balance support: During functional activity, Single extremity supported Standing balance-Leahy Scale: Fair                             ADL either performed or assessed with clinical judgement   ADL Overall ADL's : Needs assistance/impaired     Grooming: Standing;Oral care;Supervision/safety                   Toilet Transfer: Supervision/safety;Rolling walker (2 wheels)   Toileting- Clothing Manipulation and Hygiene: Cueing for safety;Supervision/safety       Functional mobility during ADLs: Supervision/safety;Rolling walker (2 wheels) General ADL Comments: Intermittent cueing for safe use of RW during functional mobility/transfers.     Vision Patient Visual Report: No change from baseline       Perception  Praxis      Pertinent Vitals/Pain Pain Assessment Pain Assessment: 0-10 Pain Score: 3  Pain Location: L knee Pain Descriptors / Indicators: Discomfort, Guarding Pain Intervention(s): Limited activity within patient's tolerance, Monitored during session, Premedicated before session      Hand Dominance     Extremity/Trunk Assessment Upper Extremity Assessment Upper Extremity Assessment: Overall WFL for tasks assessed   Lower Extremity Assessment Lower Extremity Assessment: LLE deficits/detail;Defer to PT evaluation LLE Deficits / Details: s/P L TKA   Cervical / Trunk Assessment Cervical / Trunk Assessment: Normal   Communication Communication Communication: No difficulties   Cognition Arousal/Alertness: Awake/alert Behavior During Therapy: WFL for tasks assessed/performed Overall Cognitive Status: Within Functional Limits for tasks assessed                                       General Comments       Exercises Other Exercises Other Exercises: Pt educated on role of OT in acute setting, safe use of AE/DME for ADL management, polar care management, falls prevention, and compression stocking management. Demos excellent recall of prior education from recent R TKA.   Shoulder Instructions      Home Living Family/patient expects to be discharged to:: Private residence Living Arrangements: Spouse/significant other Available Help at Discharge: Family;Available PRN/intermittently Type of Home: House Home Access: Stairs to enter [employer] of Steps: 5 Entrance Stairs-Rails: Right Home Layout: One level     Bathroom Shower/Tub: Producer, television/film/video: Standard Bathroom Accessibility: Yes How Accessible: Accessible via walker Home Equipment: Rolling Walker (2 wheels)          Prior Functioning/Environment Prior Level of Function : Independent/Modified Independent;Driving             Mobility Comments: Pt works, drives and is able to stay active ADLs Comments: Pt works, drives and is able to stay active        OT Problem List: Decreased strength;Decreased coordination;Decreased range of motion;Decreased activity tolerance;Decreased safety awareness      OT Treatment/Interventions: Self-care/ADL  training;Therapeutic exercise;Balance training;Therapeutic activities;DME and/or AE instruction;Patient/family education    OT Goals(Current goals can be found in the care plan section) Acute Rehab OT Goals Patient Stated Goal: to go home OT Goal Formulation: With patient Time For Goal Achievement: 01/29/22 Potential to Achieve Goals: Good ADL Goals Pt Will Perform Grooming: standing;with modified independence Pt Will Perform Lower Body Dressing: with adaptive equipment;with modified independence;sit to/from stand Pt Will Transfer to Toilet: ambulating;with modified independence;regular height toilet Pt Will Perform Toileting - Clothing Manipulation and hygiene: sit to/from stand;with modified independence;with adaptive equipment  OT Frequency: Min 2X/week    Co-evaluation              AM-PAC OT "6 Clicks" Daily Activity     Outcome Measure Help from another person eating meals?: None Help from another person taking care of personal grooming?: A Little Help from another person toileting, which includes using toliet, bedpan, or urinal?: A Little Help from another person bathing (including washing, rinsing, drying)?: A Little Help from another person to put on and taking off regular upper body clothing?: None Help from another person to put on and taking off regular lower body clothing?: A Little 6 Click Score: 20   End of Session Equipment Utilized During Treatment: Gait belt;Rolling walker (2 wheels)  Activity Tolerance: Patient tolerated treatment well Patient left: in chair;with chair  alarm set;with call bell/phone within reach [facility] care machine not working at start/end of session. MD/PA aware and in room during session.)  OT Visit Diagnosis: Other abnormalities of gait and mobility (R26.89);Pain Pain - Right/Left: Left Pain - part of body: Knee;Leg                Time: 8315-1761 OT Time Calculation (min): 32 min Charges:  OT General Charges $OT Visit: 1 Visit OT  Evaluation $OT Eval Low Complexity: 1 Low OT Treatments $Self Care/Home Management : 8-22 mins  Rockney Ghee, M.S., OTR/L 01/15/22, 10:44 AM

## 2022-01-15 NOTE — Discharge Summary (Cosign Needed)
Physician Discharge Summary  Patient ID: Carrie Pham MRN: [id number] DOB/AGE: 03/19/1960 61 y.o.  Admit date: 01/14/2022 Discharge date: 01/15/2022  Admission Diagnoses:  Total knee replacement status [Z96.659]  Surgeries:Procedure(s): Left total knee arthroplasty using computer-assisted navigation   SURGEON:  Jena Gauss. M.D.   ANESTHESIA: spinal   ESTIMATED BLOOD LOSS: 50 mL   FLUIDS REPLACED: 1000 mL of crystalloid   TOURNIQUET TIME: 77 minutes   DRAINS: 2 medium Hemovac drains   SOFT TISSUE RELEASES: Anterior cruciate ligament, posterior cruciate ligament, deep medial collateral ligament, patellofemoral ligament   IMPLANTS UTILIZED: DePuy Attune size 4 posterior stabilized femoral component (cemented), size 5 rotating platform tibial component (cemented), 38 mm medialized dome patella (cemented), and a 5 mm stabilized rotating platform polyethylene insert.  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   Status post total right knee replacement 10/27/2021   Primary osteoarthritis of left knee 09/12/2021   Encounter for general adult medical examination with abnormal findings 05/12/2021   Chronic pain of right knee 05/12/2021   Overweight (BMI 25.0-29.9) 05/12/2021   Atypical squamous cells of undetermined significance on cytologic smear of cervix (ASC-US) 01/13/2021   Right thigh pain 02/24/2020   Controlled diabetes mellitus type 2 with complications (HCC) 02/24/2020   Restless leg syndrome 02/24/2020   Fibromyalgia    Hypertension    Hypothyroidism    Arthritis    Class 1 obesity due to excess calories with serious comorbidity and body mass index (BMI) of 31.0 to 31.9 in adult 12/09/2015   Prediabetes 05/25/2015   Anemia 05/21/2015   Insomnia 05/21/2015   Depression 03/23/2015    Past Medical History:  Diagnosis Date   Arthritis    Fibromyalgia    History of chicken pox     Hypertension    Hypothyroidism    Impaired fasting blood sugar    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 female who was admitted 01/14/2022 with a diagnosis of left knee osteoarthritis and went to the operating room on 01/14/2022 and underwent left total knee arthoplasty. The patient received perioperative antibiotics for prophylaxis (see below). The patient tolerated the procedure well and was transported to PACU in stable condition. After meeting PACU criteria, the patient was subsequently transferred to the Orthopaedics/Rehabilitation unit.   The patient received DVT prophylaxis in the form of early mobilization, Lovenox, TED hose, and SCDs . A sacral pad had been placed and heels were elevated off of the bed with rolled towels in order to protect skin integrity. Foley catheter was discontinued on postoperative day #0. Wound drains were discontinued on postoperative day #1. The surgical incision was healing well without signs of infection.  Physical therapy was initiated postoperatively for transfers, gait training, and strengthening. Occupational therapy was initiated for activities of daily living and evaluation for assisted devices. Rehabilitation goals were reviewed in detail with the patient. The patient made steady progress with physical therapy and physical therapy recommended discharge to Home.   The patient achieved the preliminary goals of this hospitalization and was felt to be medically and orthopaedically appropriate for discharge.  She was given perioperative antibiotics:  Anti-infectives (From admission, onward)    Start     Dose/Rate Route  Frequency Ordered Stop   01/14/22 1800  ceFAZolin (ANCEF) IVPB 2g/100 mL premix        2 g 200 mL/hr over 30 Minutes Intravenous Every 8 hours 01/14/22  1619 01/15/22 0408   01/14/22 1013  vancomycin (VANCOCIN) 1-5 GM/200ML-% IVPB       Note to Pharmacy: Gwynneth Aliment J: cabinet override      01/14/22 1013 01/14/22 2229   01/14/22 0945  ceFAZolin (ANCEF) IVPB 2g/100 mL premix        2 g 200 mL/hr over 30 Minutes Intravenous On call to O.R. 01/14/22 0934 01/14/22 1223   01/14/22 0945  vancomycin (VANCOCIN) IVPB 1000 mg/200 mL premix        1,000 mg 200 mL/hr over 60 Minutes Intravenous  Once 01/14/22 0934 01/14/22 1115   01/14/22 0945  vancomycin (VANCOCIN) IVPB 1000 mg/200 mL premix  Status:  Discontinued        1,000 mg 200 mL/hr over 60 Minutes Intravenous  Once 01/14/22 0934 01/14/22 1104   01/14/22 0935  ceFAZolin (ANCEF) 2-4 GM/100ML-% IVPB       Note to Pharmacy: Brain Hilts F: cabinet override      01/14/22 0935 01/14/22 1229     .  Recent vital signs:  Vitals:   01/15/22 0356 01/15/22 0725  BP: (!) 150/59 125/64  Pulse: 63 (!) 57  Resp: 18 17  Temp: (!) 97.5 F (36.4 C) 97.7 F (36.5 C)  SpO2: 99% 97%    Recent laboratory studies:  No results for input(s): "WBC", "HGB", "HCT", "PLT", "K", "CL", "CO2", "BUN", "CREATININE", "GLUCOSE", "CALCIUM", "LABPT", "INR" in the last 72 hours.  Diagnostic Studies: DG Knee Left Port  Result Date: 01/14/2022 CLINICAL DATA:  Postop computer-assisted total left knee arthroplasty. EXAM: PORTABLE LEFT KNEE - 1-2 VIEW COMPARISON:  None Available. FINDINGS: Status post total left knee arthroplasty. No perihardware lucency is seen to indicate hardware failure or loosening. Expected postoperative changes including subcutaneous and intra-articular air. Mild-to-moderate joint effusion. There is a surgical drain terminating within the patellofemoral joint space. No acute fracture or dislocation. Anterior surgical skin staples are seen. IMPRESSION: Status post total left knee arthroplasty without evidence of hardware failure. Electronically Signed   By: Neita Garnet M.D.   On: 01/14/2022  15:53    Discharge Medications:   Allergies as of 01/15/2022       Reactions   Codeine Itching        Medication List     TAKE these medications    acetaminophen 500 MG tablet Commonly known as: TYLENOL Take 1,000 mg by mouth every 6 (six) hours as needed.   celecoxib 200 MG capsule Commonly known as: CELEBREX Take 1 capsule (200 mg total) by mouth 2 (two) times daily.   citalopram 20 MG tablet Commonly known as: CELEXA Take 1 tablet (20 mg total) by mouth daily.   enoxaparin 40 MG/0.4ML injection Commonly known as: LOVENOX Inject 0.4 mLs (40 mg total) into the skin daily for 14 days.   fluconazole 100 MG tablet Commonly known as: DIFLUCAN Take 100 mg by mouth daily as needed. As needed for yeast infection   levothyroxine 125 MCG tablet Commonly known as: SYNTHROID TAKE 1 TABLET BY MOUTH ONCE DAILY IN THE MORNING BEFORE BREAKFAST   Melatonin 10 MG Tabs Take by mouth.   metFORMIN 500 MG tablet Commonly known as: GLUCOPHAGE Take 500 mg by mouth daily with breakfast.   metoprolol tartrate 50 MG tablet Commonly known as: LOPRESSOR Take 1  tablet (50 mg total) by mouth 2 (two) times daily. What changed: when to take this   oxyCODONE 5 MG immediate release tablet Commonly known as: Oxy IR/ROXICODONE Take 1 tablet (5 mg total) by mouth every 4 (four) hours as needed for severe pain.   traMADol 50 MG tablet Commonly known as: ULTRAM Take 1 tablet (50 mg total) by mouth every 4 (four) hours as needed for moderate pain.               Durable Medical Equipment  (From admission, onward)           Start     Ordered   01/14/22 1619  DME Walker rolling  Once       Question:  Patient needs a walker to treat with the following condition  Answer:  Total knee replacement status   01/14/22 1619   01/14/22 1619  DME Bedside commode  Once       Comments: Patient is not able to walk the distance required to go the bathroom, or he/she is unable to safely  negotiate stairs required to access the bathroom.  A 3in1 BSC will alleviate this problem  Question:  Patient needs a bedside commode to treat with the following condition  Answer:  Total knee replacement status   01/14/22 1619            Disposition: Home with home health PT     Follow-up Information     Anson Oregon, PA-C Follow up on 01/28/2022.   Specialty: Physician Assistant Why: at 1:45pm Contact information: [address] [place] [address] [phone]         Donato Heinz, MD Follow up on 02/24/2022.   Specialty: Orthopedic Surgery Why: at 2:45pm Contact information: [address] [facility] [place] [address] [phone]                  Lasandra Beech, PA-C 01/15/2022, 9:31 AM

## 2022-01-16 DIAGNOSIS — G2581 Restless legs syndrome: Secondary | ICD-10-CM | POA: Diagnosis not present

## 2022-01-16 DIAGNOSIS — G4700 Insomnia, unspecified: Secondary | ICD-10-CM | POA: Diagnosis not present

## 2022-01-16 DIAGNOSIS — E119 Type 2 diabetes mellitus without complications: Secondary | ICD-10-CM | POA: Diagnosis not present

## 2022-01-16 DIAGNOSIS — G8929 Other chronic pain: Secondary | ICD-10-CM | POA: Diagnosis not present

## 2022-01-16 DIAGNOSIS — E039 Hypothyroidism, unspecified: Secondary | ICD-10-CM | POA: Diagnosis not present

## 2022-01-16 DIAGNOSIS — I10 Essential (primary) hypertension: Secondary | ICD-10-CM | POA: Diagnosis not present

## 2022-01-16 DIAGNOSIS — Z7984 Long term (current) use of oral hypoglycemic drugs: Secondary | ICD-10-CM | POA: Diagnosis not present

## 2022-01-16 DIAGNOSIS — M1990 Unspecified osteoarthritis, unspecified site: Secondary | ICD-10-CM | POA: Diagnosis not present

## 2022-01-16 DIAGNOSIS — Z471 Aftercare following joint replacement surgery: Secondary | ICD-10-CM | POA: Diagnosis not present

## 2022-01-16 DIAGNOSIS — Z96652 Presence of left artificial knee joint: Secondary | ICD-10-CM | POA: Diagnosis not present

## 2022-01-16 DIAGNOSIS — R69 Illness, unspecified: Secondary | ICD-10-CM | POA: Diagnosis not present

## 2022-01-16 DIAGNOSIS — Z9181 History of falling: Secondary | ICD-10-CM | POA: Diagnosis not present

## 2022-01-16 DIAGNOSIS — D649 Anemia, unspecified: Secondary | ICD-10-CM | POA: Diagnosis not present

## 2022-01-16 DIAGNOSIS — M797 Fibromyalgia: Secondary | ICD-10-CM | POA: Diagnosis not present

## 2022-01-16 DIAGNOSIS — Z96651 Presence of right artificial knee joint: Secondary | ICD-10-CM | POA: Diagnosis not present

## 2022-01-16 DIAGNOSIS — E663 Overweight: Secondary | ICD-10-CM | POA: Diagnosis not present

## 2022-01-16 NOTE — Anesthesia Post-op Follow-up Note (Signed)
  Anesthesia Pain Follow-up Note  Patient: Carrie Pham  Day #: 1  Date of Follow-up: 01/16/2022 Time: 8:12 AM  Last Vitals:  Vitals:   01/15/22 0725 01/15/22 1119  BP: 125/64 (!) 136/55  Pulse: (!) 57 61  Resp: 17   Temp: 36.5 C   SpO2: 97%     Level of Consciousness: alert  Pain: none   Side Effects:None  Catheter Site Exam:clean, dry, no drainage  Anti-Coag Meds (From admission, onward)    Start     Dose/Rate Route Frequency Ordered Stop   01/15/22 0000  enoxaparin (LOVENOX) 40 MG/0.4ML injection        40 mg Subcutaneous Every 24 hours 01/15/22 0930 01/29/22 2359        Plan: D/C from anesthesia care at surgeon's request  [facility]

## 2022-01-16 NOTE — Anesthesia Postprocedure Evaluation (Addendum)
Anesthesia Post Note  Patient: Carrie Pham  Procedure(s) Performed: COMPUTER ASSISTED TOTAL KNEE ARTHROPLASTY - RNFA (Left: Knee)  Patient location during evaluation: Nursing Unit Anesthesia Type: Spinal Level of consciousness: awake and alert Pain management: pain level controlled Vital Signs Assessment: post-procedure vital signs reviewed and stable Respiratory status: spontaneous breathing, nonlabored ventilation, respiratory function stable and patient connected to nasal cannula oxygen Cardiovascular status: blood pressure returned to baseline and stable Postop Assessment: no apparent nausea or vomiting Anesthetic complications: no  No notable events documented.   Last Vitals:  Vitals:   01/15/22 0725 01/15/22 1119  BP: 125/64 (!) 136/55  Pulse: (!) 57 61  Resp: 17   Temp: 36.5 C   SpO2: 97%     Last Pain:  Vitals:   01/15/22 0839  TempSrc:   PainSc: 5                  Stephanie Coup

## 2022-01-18 ENCOUNTER — Encounter: Payer: Self-pay | Admitting: Orthopedic Surgery

## 2022-01-19 DIAGNOSIS — Z471 Aftercare following joint replacement surgery: Secondary | ICD-10-CM | POA: Diagnosis not present

## 2022-01-20 DIAGNOSIS — Z471 Aftercare following joint replacement surgery: Secondary | ICD-10-CM | POA: Diagnosis not present

## 2022-01-21 ENCOUNTER — Encounter: Payer: Self-pay | Admitting: Orthopedic Surgery

## 2022-01-22 DIAGNOSIS — E663 Overweight: Secondary | ICD-10-CM | POA: Diagnosis not present

## 2022-01-22 DIAGNOSIS — Z96652 Presence of left artificial knee joint: Secondary | ICD-10-CM | POA: Diagnosis not present

## 2022-01-22 DIAGNOSIS — M797 Fibromyalgia: Secondary | ICD-10-CM | POA: Diagnosis not present

## 2022-01-22 DIAGNOSIS — E039 Hypothyroidism, unspecified: Secondary | ICD-10-CM | POA: Diagnosis not present

## 2022-01-22 DIAGNOSIS — D649 Anemia, unspecified: Secondary | ICD-10-CM | POA: Diagnosis not present

## 2022-01-22 DIAGNOSIS — Z9181 History of falling: Secondary | ICD-10-CM | POA: Diagnosis not present

## 2022-01-22 DIAGNOSIS — I10 Essential (primary) hypertension: Secondary | ICD-10-CM | POA: Diagnosis not present

## 2022-01-22 DIAGNOSIS — Z471 Aftercare following joint replacement surgery: Secondary | ICD-10-CM | POA: Diagnosis not present

## 2022-01-22 DIAGNOSIS — G4700 Insomnia, unspecified: Secondary | ICD-10-CM | POA: Diagnosis not present

## 2022-01-22 DIAGNOSIS — E119 Type 2 diabetes mellitus without complications: Secondary | ICD-10-CM | POA: Diagnosis not present

## 2022-01-22 DIAGNOSIS — Z7984 Long term (current) use of oral hypoglycemic drugs: Secondary | ICD-10-CM | POA: Diagnosis not present

## 2022-01-22 DIAGNOSIS — R69 Illness, unspecified: Secondary | ICD-10-CM | POA: Diagnosis not present

## 2022-01-22 DIAGNOSIS — G2581 Restless legs syndrome: Secondary | ICD-10-CM | POA: Diagnosis not present

## 2022-01-22 DIAGNOSIS — Z96651 Presence of right artificial knee joint: Secondary | ICD-10-CM | POA: Diagnosis not present

## 2022-01-22 DIAGNOSIS — G8929 Other chronic pain: Secondary | ICD-10-CM | POA: Diagnosis not present

## 2022-01-22 DIAGNOSIS — M1990 Unspecified osteoarthritis, unspecified site: Secondary | ICD-10-CM | POA: Diagnosis not present

## 2022-01-24 DIAGNOSIS — Z9181 History of falling: Secondary | ICD-10-CM | POA: Diagnosis not present

## 2022-01-24 DIAGNOSIS — E663 Overweight: Secondary | ICD-10-CM | POA: Diagnosis not present

## 2022-01-24 DIAGNOSIS — Z471 Aftercare following joint replacement surgery: Secondary | ICD-10-CM | POA: Diagnosis not present

## 2022-01-24 DIAGNOSIS — R69 Illness, unspecified: Secondary | ICD-10-CM | POA: Diagnosis not present

## 2022-01-24 DIAGNOSIS — G8929 Other chronic pain: Secondary | ICD-10-CM | POA: Diagnosis not present

## 2022-01-24 DIAGNOSIS — Z96651 Presence of right artificial knee joint: Secondary | ICD-10-CM | POA: Diagnosis not present

## 2022-01-24 DIAGNOSIS — D649 Anemia, unspecified: Secondary | ICD-10-CM | POA: Diagnosis not present

## 2022-01-24 DIAGNOSIS — G4700 Insomnia, unspecified: Secondary | ICD-10-CM | POA: Diagnosis not present

## 2022-01-24 DIAGNOSIS — M1990 Unspecified osteoarthritis, unspecified site: Secondary | ICD-10-CM | POA: Diagnosis not present

## 2022-01-24 DIAGNOSIS — E039 Hypothyroidism, unspecified: Secondary | ICD-10-CM | POA: Diagnosis not present

## 2022-01-24 DIAGNOSIS — M797 Fibromyalgia: Secondary | ICD-10-CM | POA: Diagnosis not present

## 2022-01-24 DIAGNOSIS — G2581 Restless legs syndrome: Secondary | ICD-10-CM | POA: Diagnosis not present

## 2022-01-24 DIAGNOSIS — Z96652 Presence of left artificial knee joint: Secondary | ICD-10-CM | POA: Diagnosis not present

## 2022-01-24 DIAGNOSIS — I10 Essential (primary) hypertension: Secondary | ICD-10-CM | POA: Diagnosis not present

## 2022-01-24 DIAGNOSIS — Z7984 Long term (current) use of oral hypoglycemic drugs: Secondary | ICD-10-CM | POA: Diagnosis not present

## 2022-01-24 DIAGNOSIS — E119 Type 2 diabetes mellitus without complications: Secondary | ICD-10-CM | POA: Diagnosis not present

## 2022-01-26 DIAGNOSIS — M1990 Unspecified osteoarthritis, unspecified site: Secondary | ICD-10-CM | POA: Diagnosis not present

## 2022-01-26 DIAGNOSIS — D649 Anemia, unspecified: Secondary | ICD-10-CM | POA: Diagnosis not present

## 2022-01-26 DIAGNOSIS — E119 Type 2 diabetes mellitus without complications: Secondary | ICD-10-CM | POA: Diagnosis not present

## 2022-01-26 DIAGNOSIS — I10 Essential (primary) hypertension: Secondary | ICD-10-CM | POA: Diagnosis not present

## 2022-01-26 DIAGNOSIS — Z96652 Presence of left artificial knee joint: Secondary | ICD-10-CM | POA: Diagnosis not present

## 2022-01-26 DIAGNOSIS — G8929 Other chronic pain: Secondary | ICD-10-CM | POA: Diagnosis not present

## 2022-01-26 DIAGNOSIS — M797 Fibromyalgia: Secondary | ICD-10-CM | POA: Diagnosis not present

## 2022-01-26 DIAGNOSIS — E039 Hypothyroidism, unspecified: Secondary | ICD-10-CM | POA: Diagnosis not present

## 2022-01-26 DIAGNOSIS — G2581 Restless legs syndrome: Secondary | ICD-10-CM | POA: Diagnosis not present

## 2022-01-26 DIAGNOSIS — G4700 Insomnia, unspecified: Secondary | ICD-10-CM | POA: Diagnosis not present

## 2022-01-26 DIAGNOSIS — Z96651 Presence of right artificial knee joint: Secondary | ICD-10-CM | POA: Diagnosis not present

## 2022-01-26 DIAGNOSIS — E663 Overweight: Secondary | ICD-10-CM | POA: Diagnosis not present

## 2022-01-26 DIAGNOSIS — Z471 Aftercare following joint replacement surgery: Secondary | ICD-10-CM | POA: Diagnosis not present

## 2022-01-26 DIAGNOSIS — Z7984 Long term (current) use of oral hypoglycemic drugs: Secondary | ICD-10-CM | POA: Diagnosis not present

## 2022-01-26 DIAGNOSIS — Z9181 History of falling: Secondary | ICD-10-CM | POA: Diagnosis not present

## 2022-01-26 DIAGNOSIS — R69 Illness, unspecified: Secondary | ICD-10-CM | POA: Diagnosis not present

## 2022-02-01 DIAGNOSIS — Z96652 Presence of left artificial knee joint: Secondary | ICD-10-CM | POA: Diagnosis not present

## 2022-02-04 DIAGNOSIS — Z96652 Presence of left artificial knee joint: Secondary | ICD-10-CM | POA: Diagnosis not present

## 2022-02-08 DIAGNOSIS — Z96652 Presence of left artificial knee joint: Secondary | ICD-10-CM | POA: Diagnosis not present

## 2022-02-11 DIAGNOSIS — Z96652 Presence of left artificial knee joint: Secondary | ICD-10-CM | POA: Diagnosis not present

## 2022-02-15 DIAGNOSIS — Z96652 Presence of left artificial knee joint: Secondary | ICD-10-CM | POA: Diagnosis not present

## 2022-02-17 DIAGNOSIS — Z96652 Presence of left artificial knee joint: Secondary | ICD-10-CM | POA: Diagnosis not present

## 2022-02-22 DIAGNOSIS — Z96652 Presence of left artificial knee joint: Secondary | ICD-10-CM | POA: Diagnosis not present

## 2022-02-24 DIAGNOSIS — Z96652 Presence of left artificial knee joint: Secondary | ICD-10-CM | POA: Diagnosis not present

## 2022-03-01 DIAGNOSIS — Z96652 Presence of left artificial knee joint: Secondary | ICD-10-CM | POA: Diagnosis not present

## 2022-03-03 DIAGNOSIS — Z96652 Presence of left artificial knee joint: Secondary | ICD-10-CM | POA: Diagnosis not present

## 2022-03-09 DIAGNOSIS — N760 Acute vaginitis: Secondary | ICD-10-CM | POA: Diagnosis not present

## 2022-03-09 DIAGNOSIS — M25662 Stiffness of left knee, not elsewhere classified: Secondary | ICD-10-CM | POA: Diagnosis not present

## 2022-03-09 DIAGNOSIS — Z01419 Encounter for gynecological examination (general) (routine) without abnormal findings: Secondary | ICD-10-CM | POA: Diagnosis not present

## 2022-03-09 DIAGNOSIS — Z760 Encounter for issue of repeat prescription: Secondary | ICD-10-CM | POA: Diagnosis not present

## 2022-03-09 DIAGNOSIS — Z96652 Presence of left artificial knee joint: Secondary | ICD-10-CM | POA: Diagnosis not present

## 2022-03-09 DIAGNOSIS — Z6829 Body mass index (BMI) 29.0-29.9, adult: Secondary | ICD-10-CM | POA: Diagnosis not present

## 2022-03-15 LAB — HM PAP SMEAR

## 2022-03-17 ENCOUNTER — Other Ambulatory Visit: Payer: Self-pay

## 2022-03-17 ENCOUNTER — Emergency Department (HOSPITAL_COMMUNITY): Payer: 59

## 2022-03-17 ENCOUNTER — Emergency Department (HOSPITAL_COMMUNITY)
Admission: EM | Admit: 2022-03-17 | Discharge: 2022-03-17 | Disposition: A | Discharge status: Home / Self Care | Payer: 59 | Attending: Emergency Medicine | Admitting: Emergency Medicine

## 2022-03-17 DIAGNOSIS — Z79899 Other long term (current) drug therapy: Secondary | ICD-10-CM | POA: Diagnosis not present

## 2022-03-17 DIAGNOSIS — R100 Acute abdomen: Secondary | ICD-10-CM | POA: Diagnosis not present

## 2022-03-17 DIAGNOSIS — E1165 Type 2 diabetes mellitus with hyperglycemia: Secondary | ICD-10-CM | POA: Insufficient documentation

## 2022-03-17 DIAGNOSIS — R55 Syncope and collapse: Secondary | ICD-10-CM | POA: Diagnosis not present

## 2022-03-17 DIAGNOSIS — I10 Essential (primary) hypertension: Secondary | ICD-10-CM | POA: Insufficient documentation

## 2022-03-17 DIAGNOSIS — R1084 Generalized abdominal pain: Secondary | ICD-10-CM | POA: Diagnosis not present

## 2022-03-17 DIAGNOSIS — D72829 Elevated white blood cell count, unspecified: Secondary | ICD-10-CM | POA: Diagnosis not present

## 2022-03-17 DIAGNOSIS — E039 Hypothyroidism, unspecified: Secondary | ICD-10-CM | POA: Insufficient documentation

## 2022-03-17 DIAGNOSIS — Z7984 Long term (current) use of oral hypoglycemic drugs: Secondary | ICD-10-CM | POA: Insufficient documentation

## 2022-03-17 DIAGNOSIS — Z743 Need for continuous supervision: Secondary | ICD-10-CM | POA: Diagnosis not present

## 2022-03-17 DIAGNOSIS — R42 Dizziness and giddiness: Secondary | ICD-10-CM | POA: Diagnosis not present

## 2022-03-17 LAB — CBC
HCT: 38 % (ref 36.0–46.0)
Hemoglobin: 11.7 g/dL — ABNORMAL LOW (ref 12.0–15.0)
MCH: 25.6 pg — ABNORMAL LOW (ref 26.0–34.0)
MCHC: 30.8 g/dL (ref 30.0–36.0)
MCV: 83.2 fL (ref 80.0–100.0)
Platelets: 288 10*3/uL (ref 150–400)
RBC: 4.57 MIL/uL (ref 3.87–5.11)
RDW: 14 % (ref 11.5–15.5)
WBC: 10.8 10*3/uL — ABNORMAL HIGH (ref 4.0–10.5)
nRBC: 0 % (ref 0.0–0.2)

## 2022-03-17 LAB — TROPONIN I (HIGH SENSITIVITY)
Troponin I (High Sensitivity): 3 ng/L (ref <18)
Troponin I (High Sensitivity): <2 ng/L (ref <18)

## 2022-03-17 LAB — URINALYSIS, ROUTINE W REFLEX MICROSCOPIC
Bilirubin Urine: NEGATIVE
Glucose, UA: NEGATIVE mg/dL
Hgb urine dipstick: NEGATIVE
Ketones, ur: NEGATIVE mg/dL
Nitrite: NEGATIVE
Protein, ur: NEGATIVE mg/dL
Specific Gravity, Urine: >1.03 — ABNORMAL HIGH (ref 1.005–1.030)
pH: 5.5 (ref 5.0–8.0)

## 2022-03-17 LAB — URINALYSIS, MICROSCOPIC (REFLEX)

## 2022-03-17 LAB — BASIC METABOLIC PANEL
Anion gap: 13 (ref 5–15)
BUN: 14 mg/dL (ref 8–23)
CO2: 20 mmol/L — ABNORMAL LOW (ref 22–32)
Calcium: 9.8 mg/dL (ref 8.9–10.3)
Chloride: 103 mmol/L (ref 98–111)
Creatinine, Ser: 0.7 mg/dL (ref 0.44–1.00)
GFR, Estimated: >60 mL/min (ref >60)
Glucose, Bld: 115 mg/dL — ABNORMAL HIGH (ref 70–99)
Potassium: 4.1 mmol/L (ref 3.5–5.1)
Sodium: 136 mmol/L (ref 135–145)

## 2022-03-17 LAB — D-DIMER, QUANTITATIVE: D-Dimer, Quant: 4.52 ug/mL-FEU — ABNORMAL HIGH (ref 0.00–0.50)

## 2022-03-17 LAB — CBG MONITORING, ED: Glucose-Capillary: 110 mg/dL — ABNORMAL HIGH (ref 70–99)

## 2022-03-17 MED ORDER — IOHEXOL 350 MG/ML SOLN
58.0000 mL | Freq: Once | INTRAVENOUS | Status: AC | PRN
  Administered 2022-03-17: 58 mL via INTRAVENOUS

## 2022-03-17 MED ORDER — KETOROLAC TROMETHAMINE 15 MG/ML IJ SOLN
15.0000 mg | Freq: Once | INTRAMUSCULAR | Status: AC
  Administered 2022-03-17: 15 mg via INTRAVENOUS
  Filled 2022-03-17: qty 1

## 2022-03-17 MED ORDER — LACTATED RINGERS IV BOLUS
1000.0000 mL | Freq: Once | INTRAVENOUS | Status: AC
  Administered 2022-03-17: 1000 mL via INTRAVENOUS

## 2022-03-17 NOTE — ED Provider Notes (Signed)
[facility] Provider Note   CSN: [id number] Arrival date & time: 03/17/22  1519     History  Chief Complaint  Patient presents with   Loss of Consciousness    Carrie Pham is a 62 y.o. female.  This is a 62 year old female with history hypothyroidism on levothyroxine, type 2 diabetes, and hypertension presenting to the ED for syncopal episode.  Patient states she was at a beauty store when she suddenly had an urge to have a bowel movement, she was on her way to the bathroom and her stomach started cramping and she had a syncopal episode.  She states that she hit her head, was easily awoken afterwards by bystanders.  She denies any current chest pain however over the last 2 weeks she feels like she has had some mild discomfort over her chest.  It is worse and exacerbated with eating.  She denies palpitations, or recent illnesses.  Endorses possible dehydration as she does not drink much fluids.        Home Medications Prior to Admission medications   Medication Sig Start Date End Date Taking? Authorizing Provider  acetaminophen (TYLENOL) 500 MG tablet Take 1,000 mg by mouth every 6 (six) hours as needed.    [provider]  celecoxib (CELEBREX) 200 MG capsule Take 1 capsule (200 mg total) by mouth 2 (two) times daily. 01/15/22   Fausto Skillern, PA-C  citalopram (CELEXA) 20 MG tablet Take 1 tablet (20 mg total) by mouth daily. 05/12/21   Ria Bush, MD  enoxaparin (LOVENOX) 40 MG/0.4ML injection Inject 0.4 mLs (40 mg total) into the skin daily for 14 days. 01/15/22 01/29/22  Fausto Skillern, PA-C  fluconazole (DIFLUCAN) 100 MG tablet Take 100 mg by mouth daily as needed. As needed for yeast infection    [provider]  levothyroxine (SYNTHROID) 125 MCG tablet TAKE 1 TABLET BY MOUTH ONCE DAILY IN THE MORNING BEFORE BREAKFAST 01/04/22   Ria Bush, MD  Melatonin 10 MG TABS Take by mouth.    [provider]  metFORMIN (GLUCOPHAGE) 500 MG tablet Take 500 mg by mouth daily with breakfast.    [provider]  metoprolol tartrate (LOPRESSOR) 50 MG tablet Take 1 tablet (50 mg total) by mouth 2 (two) times daily. Patient taking differently: Take 50 mg by mouth daily. 05/12/21   Ria Bush, MD  oxyCODONE (OXY IR/ROXICODONE) 5 MG immediate release tablet Take 1 tablet (5 mg total) by mouth every 4 (four) hours as needed for severe pain. 01/15/22   Fausto Skillern, PA-C  traMADol (ULTRAM) 50 MG tablet Take 1 tablet (50 mg total) by mouth every 4 (four) hours as needed for moderate pain. 01/15/22   Fausto Skillern, PA-C      Allergies    Codeine    Review of Systems   Review of Systems  Cardiovascular:  Positive for chest pain and syncope.  Gastrointestinal:  Negative for abdominal pain.  Genitourinary:  Negative for dysuria.  Musculoskeletal:  Negative for myalgias.  Neurological:  Positive for syncope and light-headedness.    Physical Exam Updated Vital Signs BP 123/80 (BP Location: Right Arm)   Pulse 79   Temp 98.1 F (36.7 C) (Oral)   Resp 20   SpO2 97%  Physical Exam Vitals and nursing note reviewed.  Constitutional:      General: She is not in acute distress.    Appearance: Normal appearance. She is not ill-appearing or  toxic-appearing.  HENT:     Mouth/Throat:     Mouth: Mucous membranes are moist.  Eyes:     Pupils: Pupils are equal, round, and reactive to light.  Cardiovascular:     Rate and Rhythm: Normal rate and regular rhythm.     Pulses: Normal pulses.     Heart sounds: Normal heart sounds. No murmur heard.    No friction rub. No gallop.  Pulmonary:     Effort: Pulmonary effort is normal. No respiratory distress.     Breath sounds: No wheezing or rales.  Abdominal:     General: There is no distension.     Palpations: Abdomen is soft.     Tenderness: There is no abdominal tenderness. There is no guarding.  Skin:    General: Skin is  warm.     Capillary Refill: Capillary refill takes less than 2 seconds.  Neurological:     General: No focal deficit present.     Mental Status: She is alert and oriented to person, place, and time. Mental status is at baseline.     Cranial Nerves: No cranial nerve deficit.     Motor: No weakness.     Gait: Gait normal.     ED Results / Procedures / Treatments   Labs (all labs ordered are listed, but only abnormal results are displayed) Labs Reviewed  CBC - Abnormal; Notable for the following components:      Result Value   WBC 10.8 (*)    Hemoglobin 11.7 (*)    MCH 25.6 (*)    All other components within normal limits  CBG MONITORING, ED - Abnormal; Notable for the following components:   Glucose-Capillary 110 (*)    All other components within normal limits  BASIC METABOLIC PANEL  URINALYSIS, ROUTINE W REFLEX MICROSCOPIC  TROPONIN I (HIGH SENSITIVITY)    EKG EKG Interpretation  Date/Time:  Thursday March 17 2022 15:23:54 EST Ventricular Rate:  74 PR Interval:  146 QRS Duration: 92 QT Interval:  394 QTC Calculation: 437 R Axis:   17 Text Interpretation: Normal sinus rhythm Septal infarct , age undetermined Abnormal ECG When compared with ECG of 03-Jan-2022 10:42, PREVIOUS ECG IS PRESENT No significant change since last tracing Confirmed by Isla Pence [phone]) on 03/17/2022 4:00:59 PM  Radiology No results found.  Procedures Procedures    Medications Ordered in ED Medications - No data to display  ED Course/ Medical Decision Making/ A&P                             Medical Decision Making Presents with syncopal episode, it was in the setting of trying to make to the bathroom for a abdominal cramping and feeling like she has to have a bowel movement.  She denies any warm flushed feeling, current chest pain, palpitations.  Differential diagnosis includes vasovagal syncope versus orthostatic syncope versus cardiogenic syncope.  She has no focal neurologic  deficits, cranial nerves II through XII intact, finger-nose and heel-to-shin normal, I initially was unable to ambulate patient due to dizziness when she went from a lying to sitting up position.  During this episode of dizziness she had no nystagmus or other concerning findings.  Initial workup ordered in triage including a CBC, BMP, urinalysis, serial troponins, EKG, CT head and CT cervical spine without contrast.  After my evaluation I ordered 1 L LR for fluid resuscitation, as well as a D-dimer to evaluate for PE  as I believe she is low risk. I will closely reevaluate patient after fluids to see if she can ambulate and perform a neuroexam as I have low suspicion for posterior circulation stroke but cannot rule it out at this time.  I personally reviewed and interpreted patient's CT imaging and agree with radiology which shows no intracranial hemorrhage, no large territory infection, no fracture of the cervical spine.  I personally reviewed and interpreted patient's EKG which shows PR, QRS, QTc are normal, no ST segment elevations or depressions to suggest acute ischemia, no inverted T waves  I personally reviewed and interpreted patient's labs, CMP shows no anion gap, mild hyperglycemia of 115, electrolytes normal, troponin 3 and delta troponin negative less than 2.  CBC shows a mild leukocytosis of 10.8, D-dimer elevated at 4.5.  CT PE study ordered.  Urine noninfectious.  I reviewed patient's CT PE study which shows no acute pulmonary embolism, no pneumonia or other intrathoracic process.  Patient was able to ambulate and her dizziness has resolved.  She continues to have no focal neurologic deficits, cranial nerves II through XII are intact, no posterior circulation signs to make me concerned for a stroke.  I discussed with patient we are not sure of the exact cause of her episode today, it is likely vasovagal versus orthostatic.  Recommended adequate fluid intake and rest as well as close  outpatient follow-up with PCP for reevaluation in 2 to 3 days.  Return precautions given if she had any chest pain, shortness of breath, or other syncopal episodes.  Patient was stable at discharge.    Problems Addressed: Syncope, unspecified syncope type: acute illness or injury that poses a threat to life or bodily functions  Amount and/or Complexity of Data Reviewed Labs: ordered. Decision-making details documented in ED Course. Radiology: ordered and independent interpretation performed. Decision-making details documented in ED Course. ECG/medicine tests: ordered and independent interpretation performed. Decision-making details documented in ED Course.  Risk Prescription drug management.         Final Clinical Impression(s) / ED Diagnoses Final diagnoses:  None    Rx / DC Orders ED Discharge Orders     None         Jimmie Molly, MD 03/17/22 2145    Elnora Morrison, MD 03/22/22 1529

## 2022-03-17 NOTE — ED Notes (Signed)
Shift report received, assumed care of patient at this time.

## 2022-03-17 NOTE — Discharge Instructions (Addendum)
You were evaluated in the emergency department, you had a syncopal episode today.  We did an extensive workup on you which showed no concerning findings.  We were able to evaluate you for heart attack as well as blood clots in your lungs.  Your head CT also showed no bleeding.  At this time we feel you are safe to be discharged home with outpatient follow-up.  Please follow-up with your PCP in 2 to 3 days for reevaluation.  Please take in adequate hydration.  Please return to the ED if you have worsening symptoms including worsening of episodes, chest pain or shortness of breath.

## 2022-03-17 NOTE — ED Notes (Signed)
Patient ambulated to bathroom without assistance. 

## 2022-03-17 NOTE — ED Triage Notes (Signed)
Pt shopping and had a syncopal episode in the store. Unconscious for 2-3 mins. Just prior to syncope had sudden onset of abdominal pain and urge to use the bathroom. 122/62 BP lying flat and is unable to tolerate sitting up without having near syncopal episode again. Hematoma to back of head, not on blood thinners. EMS reports A/O x 4 but repetitive questioning. C collar in place.

## 2022-03-17 NOTE — ED Provider Triage Note (Signed)
Emergency Medicine Provider Triage Evaluation Note  Carrie Pham , a 62 y.o. female  was evaluated in triage.  Pt complains of syncope. She states that she was at a beauty store and suddenly had the urge to have a bowel movement and when she went to go to the bathroom suddenly had a syncopal episode. She denies any current abdominal pain, nausea, vomiting, or diarrhea. Denies any other prodromal symptoms leading up to her syncopal episode. She fell backwards and struck the back of her head.  Endorses pain to same.  She is alert and oriented.  Denies any chest pain or shortness of breath.  No leg pain and no leg swelling.  She has not been able to stand since the event without feeling like she would pass out again.  Review of Systems  Positive:  Negative:   Physical Exam  BP 123/80 (BP Location: Right Arm)   Pulse 79   Temp 98.1 F (36.7 C) (Oral)   Resp 20   SpO2 97%  Gen:   Awake, no distress   Resp:  Normal effort MSK:   Moves extremities without difficulty  Other:  No focal deficits  Medical Decision Making  Medically screening exam initiated at 3:47 PM.  Appropriate orders placed.  Carrie Pham was informed that the remainder of the evaluation will be completed by another provider, this initial triage assessment does not replace that evaluation, and the importance of remaining in the ED until their evaluation is complete.     Carrie Face, PA-C 03/17/22 1550

## 2022-03-22 ENCOUNTER — Telehealth: Payer: Self-pay | Admitting: *Deleted

## 2022-03-22 NOTE — Transitions of Care (Post Inpatient/ED Visit) (Signed)
   03/22/2022  Name: Carrie Pham MRN: [id number] DOB: [birth year]  Today's TOC FU Call Status: Today's TOC FU Call Status:: Unsuccessul Call (1st Attempt) Unsuccessful Call (1st Attempt) Date: 03/22/22  Attempted to reach the patient regarding the most recent Inpatient/ED visit.  Follow Up Plan: Additional outreach attempts will be made to reach the patient to complete the Transitions of Care (Post Inpatient/ED visit) call.   [facility] [phone]

## 2022-03-23 ENCOUNTER — Telehealth: Payer: Self-pay

## 2022-03-23 NOTE — Transitions of Care (Post Inpatient/ED Visit) (Signed)
   03/23/2022  Name: Carrie Pham MRN: [id number] DOB: [birth year]  Today's TOC FU Call Status: Today's TOC FU Call Status:: Unsuccessful Call (2nd Attempt) Unsuccessful Call (2nd Attempt) Date: 03/23/22 (Red on EMMI-ED Discharge Alert Date & Reason:03/19/22-"Scheduled follow-up appt? No")  Attempted to reach the patient regarding the most recent Inpatient/ED visit.  Follow Up Plan: Additional outreach attempts will be made to reach the patient to complete the Transitions of Care (Post Inpatient/ED visit) call.     Enzo Montgomery, RN,BSN,CCM [facility] Care Management Care Management Community Coordinator Direct Phone: [phone] Toll Free: [phone] Fax: [phone]

## 2022-03-24 ENCOUNTER — Telehealth: Payer: Self-pay

## 2022-03-24 NOTE — Transitions of Care (Post Inpatient/ED Visit) (Signed)
   03/24/2022  Name: Carrie Pham MRN: [id number] DOB: 09-30-1960  Today's TOC FU Call Status: Today's TOC FU Call Status:: Successful TOC FU Call Competed Unsuccessful Call (2nd Attempt) Date:  (Red on EMMI-ED Discharge Alert Date & Reason: 03/19/22-"Scheduled follow-up appt? No") TOC FU Call Complete Date: 03/24/22  Transition Care Management Follow-up Telephone Call Date of Discharge: 03/17/22 Discharge [facility]) Type of Discharge: Emergency Department Reason for ED Visit: Other: ("syncope") How have you been since you were released from the hospital?: Better (Spoke with spouse who voices that patient is feeling much better-so much better that she doesn't  think she needs to f/u with PCP. She has not seen MD this year-next appt in May. Encouraged spouse to have patient make appt even though she is better.) Any questions or concerns?: No  Items Reviewed: Did you receive and understand the discharge instructions provided?: Yes Medications obtained and verified?: No (spouse does not have list of meds with him) Any new allergies since your discharge?: No Dietary orders reviewed?: NA Do you have support at home?: Yes People in Home: spouse Name of Support/Comfort Primary Source: [facility] and Equipment/Supplies: [place] Ordered?: NA Any new equipment or medical supplies ordered?: NA  Functional Questionnaire: Do you need assistance with bathing/showering or dressing?: No Do you need assistance with meal preparation?: No Do you need assistance with eating?: No Do you have difficulty maintaining continence: No Do you need assistance with getting out of bed/getting out of a chair/moving?: No Do you have difficulty managing or taking your medications?: No  Folllow up appointments reviewed: PCP Follow-up appointment confirmed?: No [facility] Follow-up appointment confirmed?: NA Do you need transportation to your follow-up appointment?:  No Do you understand care options if your condition(s) worsen?: Yes-patient verbalized understanding  SDOH Interventions Today    Flowsheet Row Most Recent Value  SDOH Interventions   Food Insecurity Interventions Intervention Not Indicated  Transportation Interventions Intervention Not Indicated      TOC Interventions Today    Flowsheet Row Most Recent Value  TOC Interventions   TOC Interventions Discussed/Reviewed TOC Interventions Discussed       Interventions Today    Flowsheet Row Most Recent Value  Education Interventions   Education Provided Provided Education  Provided Verbal Education On When to see the doctor  Pharmacy Interventions   Pharmacy Dicussed/Reviewed Pharmacy Topics Discussed       [facility] Care Management Care Management Community Coordinator Direct Phone: [phone] Toll Free: [phone] Fax: [phone]

## 2022-04-12 DIAGNOSIS — Z1231 Encounter for screening mammogram for malignant neoplasm of breast: Secondary | ICD-10-CM | POA: Diagnosis not present

## 2022-04-13 LAB — HM MAMMOGRAPHY

## 2022-05-14 ENCOUNTER — Other Ambulatory Visit: Payer: Self-pay | Admitting: Family Medicine

## 2022-05-14 DIAGNOSIS — D649 Anemia, unspecified: Secondary | ICD-10-CM

## 2022-05-14 DIAGNOSIS — E039 Hypothyroidism, unspecified: Secondary | ICD-10-CM

## 2022-05-14 DIAGNOSIS — E118 Type 2 diabetes mellitus with unspecified complications: Secondary | ICD-10-CM

## 2022-05-17 ENCOUNTER — Other Ambulatory Visit (INDEPENDENT_AMBULATORY_CARE_PROVIDER_SITE_OTHER): Payer: 59

## 2022-05-17 DIAGNOSIS — E039 Hypothyroidism, unspecified: Secondary | ICD-10-CM

## 2022-05-17 DIAGNOSIS — D649 Anemia, unspecified: Secondary | ICD-10-CM

## 2022-05-17 DIAGNOSIS — E118 Type 2 diabetes mellitus with unspecified complications: Secondary | ICD-10-CM | POA: Diagnosis not present

## 2022-05-17 LAB — LIPID PANEL
Cholesterol: 142 mg/dL (ref 0–200)
HDL: 35.5 mg/dL — ABNORMAL LOW (ref >39.00)
LDL Cholesterol: 82 mg/dL (ref 0–99)
NonHDL: 106.03
Total CHOL/HDL Ratio: 4
Triglycerides: 118 mg/dL (ref 0.0–149.0)
VLDL: 23.6 mg/dL (ref 0.0–40.0)

## 2022-05-17 LAB — HEMOGLOBIN A1C: Hgb A1c MFr Bld: 5.9 % (ref 4.6–6.5)

## 2022-05-17 LAB — CBC WITH DIFFERENTIAL/PLATELET
Basophils Absolute: 0 10*3/uL (ref 0.0–0.1)
Basophils Relative: 0.6 % (ref 0.0–3.0)
Eosinophils Absolute: 0.1 10*3/uL (ref 0.0–0.7)
Eosinophils Relative: 2.3 % (ref 0.0–5.0)
HCT: 38.7 % (ref 36.0–46.0)
Hemoglobin: 12.2 g/dL (ref 12.0–15.0)
Lymphocytes Relative: 41.3 % (ref 12.0–46.0)
Lymphs Abs: 2.4 10*3/uL (ref 0.7–4.0)
MCHC: 31.5 g/dL (ref 30.0–36.0)
MCV: 85.1 fl (ref 78.0–100.0)
Monocytes Absolute: 0.4 10*3/uL (ref 0.1–1.0)
Monocytes Relative: 6.3 % (ref 3.0–12.0)
Neutro Abs: 2.8 10*3/uL (ref 1.4–7.7)
Neutrophils Relative %: 49.5 % (ref 43.0–77.0)
Platelets: 264 10*3/uL (ref 150.0–400.0)
RBC: 4.55 Mil/uL (ref 3.87–5.11)
RDW: 19.8 % — ABNORMAL HIGH (ref 11.5–15.5)
WBC: 5.7 10*3/uL (ref 4.0–10.5)

## 2022-05-17 LAB — COMPREHENSIVE METABOLIC PANEL
ALT: 9 U/L (ref 0–35)
AST: 12 U/L (ref 0–37)
Albumin: 4 g/dL (ref 3.5–5.2)
Alkaline Phosphatase: 107 U/L (ref 39–117)
BUN: 14 mg/dL (ref 6–23)
CO2: 27 mEq/L (ref 19–32)
Calcium: 9.2 mg/dL (ref 8.4–10.5)
Chloride: 105 mEq/L (ref 96–112)
Creatinine, Ser: 0.62 mg/dL (ref 0.40–1.20)
GFR: 95.36 mL/min (ref >60.00)
Glucose, Bld: 119 mg/dL — ABNORMAL HIGH (ref 70–99)
Potassium: 4.8 mEq/L (ref 3.5–5.1)
Sodium: 138 mEq/L (ref 135–145)
Total Bilirubin: 0.3 mg/dL (ref 0.2–1.2)
Total Protein: 7.2 g/dL (ref 6.0–8.3)

## 2022-05-17 LAB — VITAMIN B12: Vitamin B-12: <50 pg/mL

## 2022-05-17 LAB — MICROALBUMIN / CREATININE URINE RATIO
Creatinine,U: 110.7 mg/dL
Microalb Creat Ratio: 1.2 mg/g (ref 0.0–30.0)
Microalb, Ur: 1.4 mg/dL (ref 0.0–1.9)

## 2022-05-17 LAB — IBC PANEL
Iron: 87 ug/dL (ref 42–145)
Saturation Ratios: 20 % (ref 20.0–50.0)
TIBC: 435.4 ug/dL (ref 250.0–450.0)
Transferrin: 311 mg/dL (ref 212.0–360.0)

## 2022-05-17 LAB — TSH: TSH: 2.18 u[IU]/mL (ref 0.35–5.50)

## 2022-05-17 LAB — FERRITIN: Ferritin: 22.9 ng/mL (ref 10.0–291.0)

## 2022-05-17 LAB — FOLATE: Folate: 20.9 ng/mL (ref >5.9)

## 2022-05-18 ENCOUNTER — Telehealth: Payer: Self-pay

## 2022-05-18 NOTE — Telephone Encounter (Signed)
Pt rtn call.  I relayed Dr. G's message.  Pt verbalizes understanding.  

## 2022-05-18 NOTE — Telephone Encounter (Signed)
Lvm asking pt to call back. Need to relay Dr. Timoteo Expose message.  Dr. Timoteo Expose msg: We will discuss lab results in detail at your OV. But for now, your vit B12 levels are severely low! Recommend you start vit B12 1000 mcg daily over-the-counter (OTC) and we should do a vit B12 shot when you come in next week.

## 2022-05-24 ENCOUNTER — Encounter: Payer: Self-pay | Admitting: Family Medicine

## 2022-05-24 ENCOUNTER — Ambulatory Visit (INDEPENDENT_AMBULATORY_CARE_PROVIDER_SITE_OTHER): Payer: 59 | Admitting: Family Medicine

## 2022-05-24 VITALS — BP 128/68 | HR 73 | Temp 97.3°F | Ht 65.5 in | Wt 178.1 lb

## 2022-05-24 DIAGNOSIS — M797 Fibromyalgia: Secondary | ICD-10-CM | POA: Diagnosis not present

## 2022-05-24 DIAGNOSIS — Z7189 Other specified counseling: Secondary | ICD-10-CM

## 2022-05-24 DIAGNOSIS — M1990 Unspecified osteoarthritis, unspecified site: Secondary | ICD-10-CM

## 2022-05-24 DIAGNOSIS — Z96651 Presence of right artificial knee joint: Secondary | ICD-10-CM

## 2022-05-24 DIAGNOSIS — I10 Essential (primary) hypertension: Secondary | ICD-10-CM

## 2022-05-24 DIAGNOSIS — Z0000 Encounter for general adult medical examination without abnormal findings: Secondary | ICD-10-CM | POA: Diagnosis not present

## 2022-05-24 DIAGNOSIS — E118 Type 2 diabetes mellitus with unspecified complications: Secondary | ICD-10-CM

## 2022-05-24 DIAGNOSIS — Z7984 Long term (current) use of oral hypoglycemic drugs: Secondary | ICD-10-CM | POA: Diagnosis not present

## 2022-05-24 DIAGNOSIS — R55 Syncope and collapse: Secondary | ICD-10-CM

## 2022-05-24 DIAGNOSIS — Z8679 Personal history of other diseases of the circulatory system: Secondary | ICD-10-CM

## 2022-05-24 DIAGNOSIS — E538 Deficiency of other specified B group vitamins: Secondary | ICD-10-CM

## 2022-05-24 DIAGNOSIS — E039 Hypothyroidism, unspecified: Secondary | ICD-10-CM

## 2022-05-24 DIAGNOSIS — Z23 Encounter for immunization: Secondary | ICD-10-CM

## 2022-05-24 DIAGNOSIS — F3342 Major depressive disorder, recurrent, in full remission: Secondary | ICD-10-CM

## 2022-05-24 DIAGNOSIS — Z96652 Presence of left artificial knee joint: Secondary | ICD-10-CM

## 2022-05-24 MED ORDER — METOPROLOL TARTRATE 50 MG PO TABS
50.0000 mg | ORAL_TABLET | Freq: Two times a day (BID) | ORAL | 4 refills | Status: DC
Start: 2022-05-24 — End: 2023-12-04

## 2022-05-24 MED ORDER — MELATONIN 10 MG PO TABS: 1.0000 | ORAL_TABLET | Freq: Every evening | ORAL | Status: DC | PRN

## 2022-05-24 MED ORDER — LEVOTHYROXINE SODIUM 125 MCG PO TABS: ORAL_TABLET | ORAL | 4 refills | Status: DC

## 2022-05-24 MED ORDER — CYANOCOBALAMIN 1000 MCG/ML IJ SOLN
1000.0000 ug | Freq: Once | INTRAMUSCULAR | Status: AC
Start: 2022-05-24 — End: 2022-05-24
  Administered 2022-05-24: 1000 ug via INTRAMUSCULAR

## 2022-05-24 MED ORDER — CITALOPRAM HYDROBROMIDE 20 MG PO TABS: 20.0000 mg | ORAL_TABLET | Freq: Every day | ORAL | 4 refills | Status: DC

## 2022-05-24 NOTE — Assessment & Plan Note (Signed)
Advanced directive discussion - has this at home. Husband is HCPOA. Asked to bring [place] a copy.

## 2022-05-24 NOTE — Assessment & Plan Note (Signed)
Preventative protocols reviewed and updated unless pt declined. Discussed healthy diet and lifestyle.  

## 2022-05-24 NOTE — Progress Notes (Unsigned)
Ph: [phone]       Fax: [phone]   Patient ID: Carrie Pham, female    DOB: [birth year], 62 y.o.   MRN: [id number]  This visit was conducted in person.  BP 128/68   Pulse 73   Temp (!) 97.3 F (36.3 C) (Temporal)   Ht 5' 5.5" (1.664 m)   Wt 178 lb 2 oz (80.8 kg)   SpO2 97%   BMI 29.19 kg/m    CC: CPE Subjective:   HPI: Carrie Pham is a 62 y.o. female presenting on 05/24/2022 for Annual Exam   S/p R knee replacement 10/2021 followed by L knee replacement 12/2021 ([facility]).    DM - on metformin 500mg  daily. Lab Results  Component Value Date   HGBA1C 5.9 05/17/2022    Marked low b12 noted on recent labs. She's noted worsening hand numbness last few weeks, but present for years. No other paresthesias. Notes chronic fatigue.   Syncopal episode 02/2022 at store, EMS took her to ER where workup was unrevealing (CT head, neck, CTA chest). She had some abdominal cramping prior but no other premonitory symptoms including dizziness, lightheadedness, presyncope, palpitations.   H/o SVT remotely seen at ER for this (2011).  Denies h/o heart attacks.   Preventative: Colonoscopy 7-8 yrs ago - normal [address]) - records requested  Mammogram WNL last month (through [place]) - records requested Well woman exam with OBGYN Dr Vincente Poli - seen 03/2022. Postmenopausal. Normal pap this year.  DEXA scan - several years ago, normal Lung cancer screening - not eligible Flu shot - yearly COVID vaccine Pfizer 04/2019 x2, booster 11/2019, bivalent 08/2020 Tetanus shot - unsure  Pneumonia shot - not due Shingrix - 04/2021, rpt today Advanced directive discussion - has this at home. Husband is HCPOA. Asked to bring [place] a copy.  Seat belt use discussed Sunscreen use discussed. No changing moles on skin. Non smoker Alcohol - seldom Dentist - q6 mo Eye exam - yearly - cataract surgery 2 yrs ago, none since Bowel - no constipation Bladder - no incontinence  Lives with husband Occ: processes  mortgage loans  Activity: joined gym going 3-4 /wk Diet: poor water, good fruits/vegetables      Relevant past medical, surgical, family and social history reviewed and updated as indicated. Interim medical history since our last visit reviewed. Allergies and medications reviewed and updated. Outpatient Medications Prior to Visit  Medication Sig Dispense Refill   acetaminophen (TYLENOL) 500 MG tablet Take 1,000 mg by mouth every 6 (six) hours as needed.     celecoxib (CELEBREX) 200 MG capsule Take 1 capsule (200 mg total) by mouth 2 (two) times daily. 90 capsule 0   cyanocobalamin (VITAMIN B12) 1000 MCG/ML injection Inject 1 mL (1,000 mcg total) into the muscle every 30 (thirty) days.     fluconazole (DIFLUCAN) 100 MG tablet Take 100 mg by mouth daily as needed. As needed for yeast infection     citalopram (CELEXA) 20 MG tablet Take 1 tablet (20 mg total) by mouth daily. 90 tablet 3   levothyroxine (SYNTHROID) 125 MCG tablet TAKE 1 TABLET BY MOUTH ONCE DAILY IN THE MORNING BEFORE BREAKFAST 90 tablet 1   Melatonin 10 MG TABS Take by mouth.     metFORMIN (GLUCOPHAGE) 500 MG tablet Take 500 mg by mouth daily with breakfast.     metoprolol tartrate (LOPRESSOR) 50 MG tablet Take 1 tablet (50 mg total) by mouth 2 (two) times daily. (Patient taking differently: Take 50  mg by mouth daily.) 180 tablet 3   enoxaparin (LOVENOX) 40 MG/0.4ML injection Inject 0.4 mLs (40 mg total) into the skin daily for 14 days. 5.6 mL 0   oxyCODONE (OXY IR/ROXICODONE) 5 MG immediate release tablet Take 1 tablet (5 mg total) by mouth every 4 (four) hours as needed for severe pain. 30 tablet 0   traMADol (ULTRAM) 50 MG tablet Take 1 tablet (50 mg total) by mouth every 4 (four) hours as needed for moderate pain. 30 tablet 0   No facility-administered medications prior to visit.     Per HPI unless specifically indicated in ROS section below Review of Systems  Constitutional:  Negative for activity change, appetite change,  chills, fatigue, fever and unexpected weight change.  HENT:  Negative for hearing loss.   Eyes:  Negative for visual disturbance.  Respiratory:  Negative for cough, chest tightness, shortness of breath and wheezing.   Cardiovascular:  Negative for chest pain, palpitations and leg swelling.  Gastrointestinal:  Negative for abdominal distention, abdominal pain, blood in stool, constipation, diarrhea, nausea and vomiting.  Genitourinary:  Negative for difficulty urinating and hematuria.  Musculoskeletal:  Negative for arthralgias, myalgias and neck pain.  Skin:  Negative for rash.  Neurological:  Positive for syncope (ER visit 02/2022). Negative for dizziness, seizures and headaches.  Hematological:  Negative for adenopathy. Does not bruise/bleed easily.  Psychiatric/Behavioral:  Negative for dysphoric mood. The patient is not nervous/anxious.     Objective:  BP 128/68   Pulse 73   Temp (!) 97.3 F (36.3 C) (Temporal)   Ht 5' 5.5" (1.664 m)   Wt 178 lb 2 oz (80.8 kg)   SpO2 97%   BMI 29.19 kg/m   Wt Readings from Last 3 Encounters:  05/24/22 178 lb 2 oz (80.8 kg)  03/17/22 185 lb (83.9 kg)  01/14/22 184 lb 3.2 oz (83.6 kg)      Physical Exam Vitals and nursing note reviewed.  Constitutional:      Appearance: Normal appearance. She is not ill-appearing.  HENT:     Head: Normocephalic and atraumatic.     Right Ear: Tympanic membrane, ear canal and external ear normal. There is no impacted cerumen.     Left Ear: Tympanic membrane, ear canal and external ear normal. There is no impacted cerumen.     Nose: Nose normal.     Mouth/Throat:     Mouth: Mucous membranes are moist.     Pharynx: Oropharynx is clear. No oropharyngeal exudate or posterior oropharyngeal erythema.  Eyes:     General:        Right eye: No discharge.        Left eye: No discharge.     Extraocular Movements: Extraocular movements intact.     Conjunctiva/sclera: Conjunctivae normal.     Pupils: Pupils are  equal, round, and reactive to light.  Neck:     Thyroid: No thyroid mass or thyromegaly.     Vascular: No carotid bruit.  Cardiovascular:     Rate and Rhythm: Normal rate and regular rhythm.     Pulses: Normal pulses.     Heart sounds: Normal heart sounds. No murmur heard. Pulmonary:     Effort: Pulmonary effort is normal. No respiratory distress.     Breath sounds: Normal breath sounds. No wheezing, rhonchi or rales.  Abdominal:     General: Bowel sounds are normal. There is no distension.     Palpations: Abdomen is soft. There is no mass.  Tenderness: There is no abdominal tenderness. There is no guarding or rebound.     Hernia: No hernia is present.  Musculoskeletal:     Cervical back: Normal range of motion and neck supple. No rigidity.     Right lower leg: No edema.     Left lower leg: No edema.  Lymphadenopathy:     Cervical: No cervical adenopathy.  Skin:    General: Skin is warm and dry.     Findings: No rash.  Neurological:     General: No focal deficit present.     Mental Status: She is alert. Mental status is at baseline.  Psychiatric:        Mood and Affect: Mood normal.        Behavior: Behavior normal.       Results for orders placed or performed in visit on 05/17/22  Folate  Result Value Ref Range   Folate 20.9 >5.9 ng/mL  IBC panel  Result Value Ref Range   Iron 87 42 - 145 ug/dL   Transferrin 161.0 960.4 - 360.0 mg/dL   Saturation Ratios 54.0 20.0 - 50.0 %   TIBC 435.4 250.0 - 450.0 mcg/dL  Ferritin  Result Value Ref Range   Ferritin 22.9 10.0 - 291.0 ng/mL  Vitamin B12  Result Value Ref Range   Vitamin B-12 <50 pg/mL  CBC with Differential/Platelet  Result Value Ref Range   WBC 5.7 4.0 - 10.5 K/uL   RBC 4.55 3.87 - 5.11 Mil/uL   Hemoglobin 12.2 12.0 - 15.0 g/dL   HCT 98.1 19.1 - 47.8 %   MCV 85.1 78.0 - 100.0 fl   MCHC 31.5 30.0 - 36.0 g/dL   RDW 29.5 (H) 62.1 - 30.8 %   Platelets 264.0 150.0 - 400.0 K/uL   Neutrophils Relative % 49.5  43.0 - 77.0 %   Lymphocytes Relative 41.3 12.0 - 46.0 %   Monocytes Relative 6.3 3.0 - 12.0 %   Eosinophils Relative 2.3 0.0 - 5.0 %   Basophils Relative 0.6 0.0 - 3.0 %   Neutro Abs 2.8 1.4 - 7.7 K/uL   Lymphs Abs 2.4 0.7 - 4.0 K/uL   Monocytes Absolute 0.4 0.1 - 1.0 K/uL   Eosinophils Absolute 0.1 0.0 - 0.7 K/uL   Basophils Absolute 0.0 0.0 - 0.1 K/uL  Comprehensive metabolic panel  Result Value Ref Range   Sodium 138 135 - 145 mEq/L   Potassium 4.8 3.5 - 5.1 mEq/L   Chloride 105 96 - 112 mEq/L   CO2 27 19 - 32 mEq/L   Glucose, Bld 119 (H) 70 - 99 mg/dL   BUN 14 6 - 23 mg/dL   Creatinine, Ser 6.57 0.40 - 1.20 mg/dL   Total Bilirubin 0.3 0.2 - 1.2 mg/dL   Alkaline Phosphatase 107 39 - 117 U/L   AST 12 0 - 37 U/L   ALT 9 0 - 35 U/L   Total Protein 7.2 6.0 - 8.3 g/dL   Albumin 4.0 3.5 - 5.2 g/dL   GFR 84.69 >62.95 mL/min   Calcium 9.2 8.4 - 10.5 mg/dL  Lipid panel  Result Value Ref Range   Cholesterol 142 0 - 200 mg/dL   Triglycerides 284.1 0.0 - 149.0 mg/dL   HDL 32.44 (L) >01.02 mg/dL   VLDL 72.5 0.0 - 36.6 mg/dL   LDL Cholesterol 82 0 - 99 mg/dL   Total CHOL/HDL Ratio 4    NonHDL 106.03   TSH  Result Value Ref Range  TSH 2.18 0.35 - 5.50 uIU/mL  Microalbumin / creatinine urine ratio  Result Value Ref Range   Microalb, Ur 1.4 0.0 - 1.9 mg/dL   Creatinine,U 161.0 mg/dL   Microalb Creat Ratio 1.2 0.0 - 30.0 mg/g  Hemoglobin A1c  Result Value Ref Range   Hgb A1c MFr Bld 5.9 4.6 - 6.5 %    Assessment & Plan:   Problem List Items Addressed This Visit     Controlled diabetes mellitus type 2 with complications (HCC)    Chronic, well controlled on metformin 500mg  once daily - continue this.      Relevant Medications   metFORMIN (GLUCOPHAGE) 500 MG tablet   Fibromyalgia    Continue celebrex, celexa.      Relevant Medications   citalopram (CELEXA) 20 MG tablet   Hypertension    Chronic, stable on current regimen of metoprolol -continue.       Relevant  Medications   metoprolol tartrate (LOPRESSOR) 50 MG tablet   Hypothyroidism    Chronic, stable on levothyroxine       Relevant Medications   levothyroxine (SYNTHROID) 125 MCG tablet   metoprolol tartrate (LOPRESSOR) 50 MG tablet   Arthritis   Relevant Medications   citalopram (CELEXA) 20 MG tablet   Health maintenance examination - Primary (Chronic)    Preventative protocols reviewed and updated unless pt declined. Discussed healthy diet and lifestyle.       MDD (major depressive disorder)    Chronic, stable period on celexa.       Relevant Medications   citalopram (CELEXA) 20 MG tablet   Status post total right knee replacement   Advanced directives, counseling/discussion (Chronic)    Advanced directive discussion - has this at home. Husband is HCPOA. Asked to bring [place] a copy.       Syncope    ER records reviewed. Anticipate isolated vasovagal syncope. Update if recurrence for further eval      Vitamin B12 deficiency    Markedly low B12 <50 levels in setting of metformin use.  Will provide B12 shot today then continue monthly for 3 months. Continue oral b12 daily as well. Reassess levels in 4 months with labs.       History of supraventricular tachycardia   Status post total left knee replacement   Other Visit Diagnoses     Need for shingles vaccine       Relevant Orders   Varicella-zoster vaccine IM (Completed)        Meds ordered this encounter  Medications   citalopram (CELEXA) 20 MG tablet    Sig: Take 1 tablet (20 mg total) by mouth daily.    Dispense:  90 tablet    Refill:  4   levothyroxine (SYNTHROID) 125 MCG tablet    Sig: TAKE 1 TABLET BY MOUTH ONCE DAILY IN THE MORNING BEFORE BREAKFAST    Dispense:  90 tablet    Refill:  4   metoprolol tartrate (LOPRESSOR) 50 MG tablet    Sig: Take 1 tablet (50 mg total) by mouth 2 (two) times daily.    Dispense:  180 tablet    Refill:  4   Melatonin 10 MG TABS    Sig: Take 1 tablet by mouth at  bedtime as needed.   cyanocobalamin (VITAMIN B12) injection 1,000 mcg   metFORMIN (GLUCOPHAGE) 500 MG tablet    Sig: Take 1 tablet (500 mg total) by mouth daily with breakfast.    Dispense:  90 tablet  Refill:  4   cyanocobalamin (VITAMIN B12) 1000 MCG tablet    Sig: Take 1 tablet (1,000 mcg total) by mouth daily.    Orders Placed This Encounter  Procedures   Varicella-zoster vaccine IM    Patient Instructions  2nd shingrix shot today  B12 shot today, continue monthly for the next 3 months, continue oral B12 replacement as well. Recheck B12 levels in 4 months.  We will request records from Dr [facility] [address] for latest colonoscopy and well woman exam/mammogram from Dr Vincente Poli.  Peri Jefferson to see you today Return as needed or in 1 year for next physical.   Follow up plan: Return in about 1 year (around 05/24/2023), or if symptoms worsen or fail to improve.  Eustaquio Boyden, MD

## 2022-05-24 NOTE — Patient Instructions (Addendum)
2nd shingrix shot today  B12 shot today, continue monthly for the next 3 months, continue oral B12 replacement as well. Recheck B12 levels in 4 months.  We will request records from Dr [address] [address] for latest colonoscopy and well woman exam/mammogram from Dr Vincente Poli.  Peri Jefferson to see you today Return as needed or in 1 year for next physical.

## 2022-05-25 DIAGNOSIS — E538 Deficiency of other specified B group vitamins: Secondary | ICD-10-CM | POA: Insufficient documentation

## 2022-05-25 DIAGNOSIS — R55 Syncope and collapse: Secondary | ICD-10-CM | POA: Insufficient documentation

## 2022-05-25 DIAGNOSIS — Z8679 Personal history of other diseases of the circulatory system: Secondary | ICD-10-CM | POA: Insufficient documentation

## 2022-05-25 MED ORDER — METFORMIN HCL 500 MG PO TABS: 500.0000 mg | ORAL_TABLET | Freq: Every day | ORAL | 4 refills | Status: DC

## 2022-05-25 MED ORDER — VITAMIN B-12 1000 MCG PO TABS: 1000.0000 ug | ORAL_TABLET | Freq: Every day | ORAL | Status: AC

## 2022-05-25 NOTE — Assessment & Plan Note (Addendum)
Chronic, stable on current regimen of metoprolol -continue.

## 2022-05-25 NOTE — Assessment & Plan Note (Addendum)
Markedly low B12 <50 levels in setting of metformin use.  Will provide B12 shot today then continue monthly for 3 months. Continue oral b12 daily as well. Reassess levels in 4 months with labs.

## 2022-05-25 NOTE — Assessment & Plan Note (Signed)
Chronic, well controlled on metformin 500mg  once daily - continue this.

## 2022-05-25 NOTE — Assessment & Plan Note (Addendum)
ER records reviewed. Anticipate isolated vasovagal syncope. Update if recurrence for further eval

## 2022-05-25 NOTE — Assessment & Plan Note (Signed)
Continue celebrex, celexa.

## 2022-05-25 NOTE — Assessment & Plan Note (Signed)
Chronic, stable on levothyroxine.  

## 2022-05-25 NOTE — Assessment & Plan Note (Signed)
Chronic, stable period on celexa.

## 2022-06-01 ENCOUNTER — Telehealth: Payer: Self-pay

## 2022-06-01 NOTE — Telephone Encounter (Signed)
Received a faxed message from [facility] stating pt will need to sign a medical release form with them allowing them to fax us her colonoscopy report.   Lvm asking pt to call back. Need to relay info above.  

## 2022-06-02 NOTE — Telephone Encounter (Signed)
Received a faxed message from [facility] stating pt will need to sign a medical release form with them allowing them to fax [place] her colonoscopy report.   Lvm asking pt to call back. Need to relay info above.

## 2022-06-03 NOTE — Telephone Encounter (Signed)
Spoke with relaying message from [facility]. Pt verbalizes understanding and will get it done as soon as she gets a chance.

## 2022-06-23 ENCOUNTER — Ambulatory Visit (INDEPENDENT_AMBULATORY_CARE_PROVIDER_SITE_OTHER): Payer: No Typology Code available for payment source

## 2022-06-23 DIAGNOSIS — E538 Deficiency of other specified B group vitamins: Secondary | ICD-10-CM | POA: Diagnosis not present

## 2022-06-23 MED ORDER — CYANOCOBALAMIN 1000 MCG/ML IJ SOLN
1000.0000 ug | Freq: Once | INTRAMUSCULAR | Status: AC
Start: 2022-06-23 — End: 2022-06-23
  Administered 2022-06-23: 1000 ug via INTRAMUSCULAR

## 2022-06-23 NOTE — Progress Notes (Signed)
Per orders of Dr. Kerby Nora, injection of vitamin b 12 given by Lewanda Rife in right deltoid. Patient tolerated injection well. Patient will make appointment for 1 month.

## 2022-08-16 ENCOUNTER — Ambulatory Visit: Payer: No Typology Code available for payment source

## 2022-08-18 ENCOUNTER — Ambulatory Visit: Payer: No Typology Code available for payment source

## 2022-08-18 ENCOUNTER — Telehealth: Payer: Self-pay

## 2022-08-18 DIAGNOSIS — E039 Hypothyroidism, unspecified: Secondary | ICD-10-CM

## 2022-08-18 DIAGNOSIS — E538 Deficiency of other specified B group vitamins: Secondary | ICD-10-CM | POA: Diagnosis not present

## 2022-08-18 DIAGNOSIS — E118 Type 2 diabetes mellitus with unspecified complications: Secondary | ICD-10-CM

## 2022-08-18 DIAGNOSIS — M797 Fibromyalgia: Secondary | ICD-10-CM

## 2022-08-18 MED ORDER — METFORMIN HCL 500 MG PO TABS
500.0000 mg | ORAL_TABLET | Freq: Every day | ORAL | 2 refills | Status: DC
Start: 2022-08-18 — End: 2023-07-20

## 2022-08-18 MED ORDER — CYANOCOBALAMIN 1000 MCG/ML IJ SOLN
1000.0000 ug | Freq: Once | INTRAMUSCULAR | Status: AC
Start: 2022-08-18 — End: 2022-08-18
  Administered 2022-08-18: 1000 ug via INTRAMUSCULAR

## 2022-08-18 MED ORDER — LEVOTHYROXINE SODIUM 125 MCG PO TABS
ORAL_TABLET | ORAL | 2 refills | Status: DC
Start: 2022-08-18 — End: 2023-12-04

## 2022-08-18 MED ORDER — CELECOXIB 200 MG PO CAPS
200.0000 mg | ORAL_CAPSULE | Freq: Two times a day (BID) | ORAL | 2 refills | Status: DC
Start: 2022-08-18 — End: 2023-07-18

## 2022-08-18 NOTE — Progress Notes (Signed)
Per orders of Dr. Roxy Manns, injection of B-12 given by Nanci Pina in left deltoid. Patient tolerated injection well.

## 2022-08-18 NOTE — Telephone Encounter (Signed)
Received faxed rx request from [facility] for metformin, levothyroxine and Celexa.   E-scribed refills.

## 2022-09-07 ENCOUNTER — Ambulatory Visit (INDEPENDENT_AMBULATORY_CARE_PROVIDER_SITE_OTHER): Payer: No Typology Code available for payment source | Admitting: Family Medicine

## 2022-09-07 ENCOUNTER — Encounter: Payer: Self-pay | Admitting: Family Medicine

## 2022-09-07 VITALS — BP 116/70 | HR 62 | Temp 97.8°F | Ht 65.35 in | Wt 183.0 lb

## 2022-09-07 DIAGNOSIS — Z7189 Other specified counseling: Secondary | ICD-10-CM | POA: Diagnosis not present

## 2022-09-07 DIAGNOSIS — E118 Type 2 diabetes mellitus with unspecified complications: Secondary | ICD-10-CM

## 2022-09-07 DIAGNOSIS — E669 Obesity, unspecified: Secondary | ICD-10-CM

## 2022-09-07 DIAGNOSIS — Z0000 Encounter for general adult medical examination without abnormal findings: Secondary | ICD-10-CM | POA: Diagnosis not present

## 2022-09-07 DIAGNOSIS — E538 Deficiency of other specified B group vitamins: Secondary | ICD-10-CM

## 2022-09-07 DIAGNOSIS — R202 Paresthesia of skin: Secondary | ICD-10-CM

## 2022-09-07 DIAGNOSIS — Z7985 Long-term (current) use of injectable non-insulin antidiabetic drugs: Secondary | ICD-10-CM

## 2022-09-07 DIAGNOSIS — Z6830 Body mass index (BMI) 30.0-30.9, adult: Secondary | ICD-10-CM

## 2022-09-07 DIAGNOSIS — E66811 Obesity, class 1: Secondary | ICD-10-CM

## 2022-09-07 LAB — BASIC METABOLIC PANEL
BUN: 19 mg/dL (ref 6–23)
CO2: 28 meq/L (ref 19–32)
Calcium: 9.5 mg/dL (ref 8.4–10.5)
Chloride: 106 meq/L (ref 96–112)
Creatinine, Ser: 0.62 mg/dL (ref 0.40–1.20)
GFR: 95.15 mL/min (ref >60.00)
Glucose, Bld: 100 mg/dL — ABNORMAL HIGH (ref 70–99)
Potassium: 4.9 mEq/L (ref 3.5–5.1)
Sodium: 138 meq/L (ref 135–145)

## 2022-09-07 LAB — LIPID PANEL
Cholesterol: 168 mg/dL (ref 0–200)
HDL: 38.9 mg/dL — ABNORMAL LOW (ref >39.00)
LDL Cholesterol: 97 mg/dL (ref 0–99)
NonHDL: 128.89
Total CHOL/HDL Ratio: 4
Triglycerides: 158 mg/dL — ABNORMAL HIGH (ref 0.0–149.0)
VLDL: 31.6 mg/dL (ref 0.0–40.0)

## 2022-09-07 LAB — HEMOGLOBIN A1C: Hgb A1c MFr Bld: 6.2 % (ref 4.6–6.5)

## 2022-09-07 MED ORDER — OZEMPIC (0.25 OR 0.5 MG/DOSE) 2 MG/1.5ML ~~LOC~~ SOPN: PEN_INJECTOR | SUBCUTANEOUS | 3 refills | Status: AC

## 2022-09-07 NOTE — Progress Notes (Signed)
Ph: [phone] Fax: [phone]   Patient ID: Carrie Pham, female    DOB: 11-10-1960, 62 y.o.   MRN: [id number]  This visit was conducted in person.  BP 116/70   Pulse 62   Temp 97.8 F (36.6 C) (Temporal)   Ht 5' 5.35" (1.66 m)   Wt 183 lb (83 kg)   SpO2 98%   BMI 30.12 kg/m    CC: requesting work physical  Subjective:   HPI: Carrie Pham is a 62 y.o. female presenting on 09/07/2022 for Adult wellness exam  (Biometrics labs needed and coded as "Adult Wellness Exam" )   New job - new insurance. States her insurance didn't accept prior physical.  Last had preventative health exam 05/24/2022.  She states this was not sufficient for work purposes, but needs "adult wellness exam" coded with biometric labs.  Needs new CPE by 09/18/2022.   DM - stable period on once daily metformin. She is interested in weekly medication for weight loss.    Preventative: Colonoscopy 7-8 yrs ago - normal [address]) - records requested  Mammogram 03/2022 [facility] @ [facility] Well woman exam with OBGYN Dr Vincente Poli - seen 03/2022. Postmenopausal. Normal pap this year. S/p hysterectomy 1990s, ovaries remain DEXA scan - several years ago, normal - discussed rpt at age 63yo Lung cancer screening - not eligible Flu shot - yearly COVID vaccine Pfizer 04/2019 x2, booster 11/2019, bivalent 08/2020 Tetanus shot - unsure, declines today Pneumonia shot - not due Shingrix - 04/2021, 05/2022 Advanced directive discussion - has this at home. Husband is HCPOA. Asked to bring [place] a copy.  Seat belt use discussed Sunscreen use discussed. No changing moles on skin. 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 Interim medical history since our last visit reviewed. Allergies and medications reviewed and updated. Outpatient Medications Prior to Visit  Medication Sig Dispense Refill   celecoxib (CELEBREX) 200 MG capsule Take 1 capsule (200 mg total) by mouth 2 (two) times daily. 90 capsule 2   citalopram (CELEXA) 20 MG tablet Take 1 tablet (20 mg total) by mouth daily. 90 tablet 4   cyanocobalamin (VITAMIN B12) 1000 MCG tablet Take 1 tablet (1,000 mcg total) by mouth daily.     cyanocobalamin (VITAMIN B12) 1000 MCG/ML injection Inject 1 mL (1,000 mcg total) into the muscle every 30 (thirty) days.     fluconazole (DIFLUCAN) 100 MG tablet Take 100 mg by mouth daily as needed. As needed for yeast infection     levothyroxine (SYNTHROID) 125 MCG tablet TAKE 1 TABLET BY MOUTH ONCE DAILY IN THE MORNING BEFORE BREAKFAST 90 tablet 2   Melatonin 10 MG TABS Take 1 tablet by mouth at bedtime as needed.     metFORMIN (GLUCOPHAGE) 500 MG tablet Take 1 tablet (500 mg total) by mouth daily with breakfast. 90 tablet 2   metoprolol tartrate (LOPRESSOR) 50 MG tablet Take 1 tablet (50 mg total) by mouth 2 (two) times daily. 180 tablet 4   acetaminophen (TYLENOL) 500 MG tablet Take 1,000 mg by mouth every 6 (six) hours as needed. (Patient not taking: Reported on 09/07/2022)  No facility-administered medications prior to visit.     Per HPI unless specifically indicated in ROS section below Review of Systems  Constitutional:  Negative for activity change, appetite change, chills, fatigue, fever and unexpected weight change.  HENT:  Negative for hearing loss.   Eyes:  Negative for visual disturbance.  Respiratory:  Negative for cough, chest tightness, shortness of breath and wheezing.   Cardiovascular:  Negative for chest pain, palpitations and leg swelling.  Gastrointestinal:  Negative for abdominal distention, abdominal pain, blood in stool, constipation, diarrhea, nausea and  vomiting.  Genitourinary:  Negative for difficulty urinating and hematuria.  Musculoskeletal:  Negative for arthralgias, myalgias and neck pain.  Skin:  Negative for rash.  Neurological:  Negative for dizziness, seizures, syncope and headaches.       R>L hand and forearm paresthesias worse when awakening  Hematological:  Negative for adenopathy. Does not bruise/bleed easily.  Psychiatric/Behavioral:  Negative for dysphoric mood. The patient is not nervous/anxious.     Objective:  BP 116/70   Pulse 62   Temp 97.8 F (36.6 C) (Temporal)   Ht 5' 5.35" (1.66 m)   Wt 183 lb (83 kg)   SpO2 98%   BMI 30.12 kg/m   Wt Readings from Last 3 Encounters:  09/07/22 183 lb (83 kg)  05/24/22 178 lb 2 oz (80.8 kg)  03/17/22 185 lb (83.9 kg)     Physical Exam Vitals and nursing note reviewed.  Constitutional:      Appearance: Normal appearance. She is not ill-appearing.  HENT:     Head: Normocephalic and atraumatic.     Right Ear: Tympanic membrane, ear canal and external ear normal. There is no impacted cerumen.     Left Ear: Tympanic membrane, ear canal and external ear normal. There is no impacted cerumen.     Nose: Nose normal.     Mouth/Throat:     Mouth: Mucous membranes are moist.     Pharynx: Oropharynx is clear. No oropharyngeal exudate or posterior oropharyngeal erythema.  Eyes:     General:        Right eye: No discharge.        Left eye: No discharge.     Extraocular Movements: Extraocular movements intact.     Conjunctiva/sclera: Conjunctivae normal.     Pupils: Pupils are equal, round, and reactive to light.  Neck:     Thyroid: No thyroid mass or thyromegaly.  Cardiovascular:     Rate and Rhythm: Normal rate and regular rhythm.     Pulses: Normal pulses.     Heart sounds: Normal heart sounds. No murmur heard. Pulmonary:     Effort: Pulmonary effort is normal. No respiratory distress.     Breath sounds: Normal breath sounds. No wheezing, rhonchi or rales.  Abdominal:      General: Bowel sounds are normal. There is no distension.     Palpations: Abdomen is soft. There is no mass.     Tenderness: There is no abdominal tenderness. There is no guarding or rebound.     Hernia: No hernia is present.  Musculoskeletal:     Cervical back: Normal range of motion and neck supple. No rigidity.     Right lower leg: No edema.     Left lower leg: No edema.  Lymphadenopathy:     Cervical: No cervical adenopathy.  Skin:    General: Skin is warm and dry.     Findings: No rash.  Neurological:     General:  No focal deficit present.     Mental Status: She is alert. Mental status is at baseline.     Comments:  Neg tinel/phalen  Psychiatric:        Mood and Affect: Mood normal.        Behavior: Behavior normal.       Results for orders placed or performed in visit on 05/25/22  HM MAMMOGRAPHY  Result Value Ref Range   HM Mammogram 0-4 Bi-Rad 0-4 Bi-Rad, Self Reported Normal  HM PAP SMEAR  Result Value Ref Range   HM Pap smear NILM     Assessment & Plan:   Problem List Items Addressed This Visit     Health maintenance examination - Primary (Chronic)    Preventative protocols reviewed and updated unless pt declined. Discussed healthy diet and lifestyle.  Colonoscopy records requested from [facility] Mammo/well woman exam through GYN New insurance - requests updated CPE/labs.       Advanced directives, counseling/discussion (Chronic)    Encouraged she bring in copy of her advanced directive for our records.       Controlled diabetes mellitus type 2 with complications (HCC)    Chronic, stable on metformin 500mg  daily. Patient is interested in GLP1RA Ozempic to optimize glycemic control and for weight loss benefit. Reviewed mechanism of action of medication as well as side effects and adverse events to watch for including nausea, diarrhea, constipation, pancreatitis. No fmhx medullary thyroid cancer or MEN2. Discussed titration schedule for  medication. Will start ozempic 0.25mg  weekly x 1 month then increase to 0.5mg  weekly.  Update with effect.       Relevant Medications   Semaglutide,0.25 or 0.5MG /DOS, (OZEMPIC, 0.25 OR 0.5 MG/DOSE,) 2 MG/1.5ML SOPN   Other Relevant Orders   Basic metabolic panel   Lipid panel   Hemoglobin A1c   Obesity, Class I, BMI 30-34.9    See above - start ozempic in diabetic.       Vitamin B12 deficiency    Has been receiving monthly B12 shots as well as oral daily.  Notes ongoing upper extremity neuropathy.  Update B12 levels today.  Exam not consistent with CTS. Consider EMG/NCS if not improving.       Relevant Orders   Vitamin B12   Vitamin B1   Paresthesia of arm    Bilateral arms R>L.  No signs of CTS.  Anticipate related to vit B12 deficiency.  Update B1.       Relevant Orders   Vitamin B12   Vitamin B1     Meds ordered this encounter  Medications   Semaglutide,0.25 or 0.5MG /DOS, (OZEMPIC, 0.25 OR 0.5 MG/DOSE,) 2 MG/1.5ML SOPN    Sig: Inject 0.25 mg into the skin once a week for 30 days, THEN 0.5 mg once a week.    Dispense:  1.5 mL    Refill:  3    Orders Placed This Encounter  Procedures   Basic metabolic panel   Lipid panel   Hemoglobin A1c   Vitamin B12   Vitamin B1    Patient Instructions  Sign release up front for records of colonoscopy from [facility] Bring [place] copy of your advanced directive/living will Labs today  Price out ozempic 0.25mg  weekly for the first month then increase to 0.5mg  weekly.  Keep nurse visit for next month, cancel lab visit for next month.   Follow up plan: Return if symptoms worsen or fail to improve.  Eustaquio Boyden, MD

## 2022-09-07 NOTE — Patient Instructions (Addendum)
Sign release up front for records of colonoscopy from [facility] Bring [place] copy of your advanced directive/living will Labs today  Price out ozempic 0.25mg  weekly for the first month then increase to 0.5mg  weekly.  Keep nurse visit for next month, cancel lab visit for next month.

## 2022-09-07 NOTE — Assessment & Plan Note (Signed)
See above - start ozempic in diabetic.

## 2022-09-07 NOTE — Assessment & Plan Note (Signed)
Bilateral arms R>L.  No signs of CTS.  Anticipate related to vit B12 deficiency.  Update B1.

## 2022-09-07 NOTE — Assessment & Plan Note (Signed)
Has been receiving monthly B12 shots as well as oral daily.  Notes ongoing upper extremity neuropathy.  Update B12 levels today.  Exam not consistent with CTS. Consider EMG/NCS if not improving.

## 2022-09-07 NOTE — Assessment & Plan Note (Addendum)
Chronic, stable on metformin 500mg  daily. Patient is interested in GLP1RA Ozempic to optimize glycemic control and for weight loss benefit. Reviewed mechanism of action of medication as well as side effects and adverse events to watch for including nausea, diarrhea, constipation, pancreatitis. No fmhx medullary thyroid cancer or MEN2. Discussed titration schedule for medication. Will start ozempic 0.25mg  weekly x 1 month then increase to 0.5mg  weekly.  Update with effect.

## 2022-09-07 NOTE — Assessment & Plan Note (Addendum)
Preventative protocols reviewed and updated unless pt declined. Discussed healthy diet and lifestyle.  Colonoscopy records requested from [facility] Mammo/well woman exam through GYN New insurance - requests updated CPE/labs.

## 2022-09-07 NOTE — Assessment & Plan Note (Addendum)
Encouraged she bring in copy of her advanced directive for our records.

## 2022-09-09 LAB — VITAMIN B12: Vitamin B-12: 224 pg/mL (ref 211–911)

## 2022-09-10 LAB — VITAMIN B1: Vitamin B1 (Thiamine): 7 nmol/L — ABNORMAL LOW (ref 8–30)

## 2022-09-16 ENCOUNTER — Encounter: Payer: Self-pay | Admitting: Family Medicine

## 2022-09-16 ENCOUNTER — Other Ambulatory Visit: Payer: Self-pay | Admitting: Family Medicine

## 2022-09-16 DIAGNOSIS — E519 Thiamine deficiency, unspecified: Secondary | ICD-10-CM | POA: Insufficient documentation

## 2022-09-16 MED ORDER — THIAMINE HCL 100 MG PO TABS: 100.0000 mg | ORAL_TABLET | ORAL | Status: AC

## 2022-09-20 ENCOUNTER — Ambulatory Visit: Payer: 59

## 2022-10-04 ENCOUNTER — Other Ambulatory Visit: Payer: Self-pay | Admitting: Family Medicine

## 2022-10-04 ENCOUNTER — Telehealth: Payer: Self-pay

## 2022-10-04 NOTE — Telephone Encounter (Signed)
Left message with pt that she did not need to come in for her labs scheduled for 9.19.24.

## 2022-10-05 ENCOUNTER — Telehealth: Payer: Self-pay | Admitting: Family Medicine

## 2022-10-05 NOTE — Telephone Encounter (Signed)
Patient dropped off physician result form to be filled out by provider. Patient requested to send it back via Fax within 2-days. Document is located in providers tray at front office.Please advise at Mobile [phone] (mobile)    Patient would like a phone call to ensure her that it has been faxed when faxed.

## 2022-10-06 ENCOUNTER — Other Ambulatory Visit: Payer: No Typology Code available for payment source

## 2022-10-06 NOTE — Telephone Encounter (Signed)
Spoke with pt asking for waist circumference for form. She states 31 in. Info documented. Notified pt I will fax form once Dr Reece Agar signs it. Then it will be mailed to her for her records. Pt verbalizes understanding and expresses her thanks.   Form placed in Dr Timoteo Expose box.

## 2022-10-07 NOTE — Telephone Encounter (Signed)
Faxed form. Mailed original to pt.  [Placed copy to be scanned.]

## 2022-10-07 NOTE — Telephone Encounter (Signed)
Signed and in Lisa's box.

## 2022-12-07 ENCOUNTER — Telehealth: Payer: Self-pay | Admitting: Family Medicine

## 2022-12-07 NOTE — Telephone Encounter (Signed)
Patient was given a trial of ozempic to see how she does on it.She called in today stating that she is doing well and would like to have the full rx for the .75 sent in to the pharmacy for her.  [facility] [place], [place] - [address] Phone: [phone]  Fax: [phone]

## 2022-12-08 ENCOUNTER — Ambulatory Visit: Payer: No Typology Code available for payment source

## 2022-12-08 DIAGNOSIS — E538 Deficiency of other specified B group vitamins: Secondary | ICD-10-CM | POA: Diagnosis not present

## 2022-12-08 MED ORDER — CYANOCOBALAMIN 1000 MCG/ML IJ SOLN
1000.0000 ug | Freq: Once | INTRAMUSCULAR | Status: AC
Start: 2022-12-08 — End: 2022-12-08
  Administered 2022-12-08: 1000 ug via INTRAMUSCULAR

## 2022-12-08 MED ORDER — OZEMPIC (0.25 OR 0.5 MG/DOSE) 2 MG/3ML ~~LOC~~ SOPN: 0.5000 mg | PEN_INJECTOR | SUBCUTANEOUS | 1 refills | Status: DC

## 2022-12-08 NOTE — Telephone Encounter (Signed)
Spoke with pt relaying Dr Timoteo Expose message. Pt verbalizes understanding and reports no issues, so far, getting Ozempic from pharmacy. However, pt states she was already taking 0.5 mg so she needs 1 mg dose now. Instructed pt not to pick up rx until she hears back from [place]. Pt verbalizes understanding.   Also, pt scheduled mthly vit B12 shot today at 2:30. C/x 05/2023 CPE and scheduled 6 mo DM f/u on 03/13/22 at 4:00.

## 2022-12-08 NOTE — Progress Notes (Signed)
Patient presented for B 12 injection given by Rhylan Kagel, CMA to left deltoid, patient voiced no concerns nor showed any signs of distress during injection.  

## 2022-12-08 NOTE — Addendum Note (Signed)
Addended by: Eustaquio Boyden on: 12/08/2022 12:26 PM   Modules accepted: Orders

## 2022-12-08 NOTE — Telephone Encounter (Addendum)
I'm glad she's tolerating ozempic well - I have refilled this to her pharmacy at 0.5mg  weekly. It doesn't come in 0.75mg  dose. Is it being covered well by her pharmacy?  Would recommend continued monthly b12 shots for now - last done 08/18/2022.  Please cancel 05/2023 appt and schedule 6 mo DM f/u visit for ~03/10/2023 - we can discuss further titration at that time.

## 2022-12-09 MED ORDER — SEMAGLUTIDE (1 MG/DOSE) 4 MG/3ML ~~LOC~~ SOPN: 1.0000 mg | PEN_INJECTOR | SUBCUTANEOUS | 3 refills | Status: DC

## 2022-12-09 NOTE — Telephone Encounter (Signed)
Attempted to contact pt. Vm box full. Need to relay Dr Timoteo Expose message.

## 2022-12-09 NOTE — Addendum Note (Signed)
Addended by: Eustaquio Boyden on: 12/09/2022 07:21 AM   Modules accepted: Orders

## 2022-12-09 NOTE — Telephone Encounter (Signed)
Per Carrie Pham, pt rtn call and was made aware rx sent in.

## 2022-12-09 NOTE — Telephone Encounter (Signed)
New 1mg  Rx sent to pharmacy.

## 2023-01-12 ENCOUNTER — Ambulatory Visit: Payer: No Typology Code available for payment source | Admitting: *Deleted

## 2023-01-12 DIAGNOSIS — E538 Deficiency of other specified B group vitamins: Secondary | ICD-10-CM | POA: Diagnosis not present

## 2023-01-12 MED ORDER — CYANOCOBALAMIN 1000 MCG/ML IJ SOLN
1000.0000 ug | Freq: Once | INTRAMUSCULAR | Status: AC
  Administered 2023-01-12: 1000 ug via INTRAMUSCULAR

## 2023-01-12 NOTE — Progress Notes (Signed)
Per orders of Dr. Tillman Abide (PCP out of the office today), injection of B-12 given by Blenda Mounts M in right deltoid. Patient tolerated injection well. Patient will make appointment for 1 month.

## 2023-03-14 ENCOUNTER — Ambulatory Visit: Payer: No Typology Code available for payment source | Admitting: Family Medicine

## 2023-03-28 ENCOUNTER — Encounter: Payer: Self-pay | Admitting: Family Medicine

## 2023-03-28 ENCOUNTER — Ambulatory Visit: Payer: No Typology Code available for payment source | Admitting: Family Medicine

## 2023-03-28 VITALS — BP 118/66 | HR 83 | Temp 97.9°F | Ht 65.25 in | Wt 158.4 lb

## 2023-03-28 DIAGNOSIS — E538 Deficiency of other specified B group vitamins: Secondary | ICD-10-CM

## 2023-03-28 DIAGNOSIS — E1169 Type 2 diabetes mellitus with other specified complication: Secondary | ICD-10-CM

## 2023-03-28 DIAGNOSIS — Z7984 Long term (current) use of oral hypoglycemic drugs: Secondary | ICD-10-CM

## 2023-03-28 DIAGNOSIS — Z23 Encounter for immunization: Secondary | ICD-10-CM | POA: Diagnosis not present

## 2023-03-28 DIAGNOSIS — E663 Overweight: Secondary | ICD-10-CM

## 2023-03-28 DIAGNOSIS — R202 Paresthesia of skin: Secondary | ICD-10-CM | POA: Diagnosis not present

## 2023-03-28 DIAGNOSIS — Z7985 Long-term (current) use of injectable non-insulin antidiabetic drugs: Secondary | ICD-10-CM

## 2023-03-28 DIAGNOSIS — E519 Thiamine deficiency, unspecified: Secondary | ICD-10-CM

## 2023-03-28 LAB — POCT GLYCOSYLATED HEMOGLOBIN (HGB A1C): Hemoglobin A1C: 5.7 % — AB (ref 4.0–5.6)

## 2023-03-28 MED ORDER — SEMAGLUTIDE (2 MG/DOSE) 8 MG/3ML ~~LOC~~ SOPN: 2.0000 mg | PEN_INJECTOR | SUBCUTANEOUS | 6 refills | Status: DC

## 2023-03-28 NOTE — Assessment & Plan Note (Signed)
 Last b12 shot 12/2022.  She continues vitamin B12 daily.

## 2023-03-28 NOTE — Assessment & Plan Note (Addendum)
 Discussed starting once weekly b1 100mg 

## 2023-03-28 NOTE — Assessment & Plan Note (Signed)
 Congratulated on 25 lb weight loss to date (weight from 173lbs to 158lbs) Encouraged continued healthy diet choices.  She notes she'd like goal weight to be ~130lbs.  Increase ozempic as per above.

## 2023-03-28 NOTE — Assessment & Plan Note (Signed)
 Mildly positive phalen, neg tinel.  Possible CTS component B12 is being repleted.  Has not started b1 (thiamine) vitamin replacement. Will reassess after b1 replacement.  Discussed trial wrist brace use at night.

## 2023-03-28 NOTE — Progress Notes (Addendum)
 Ph: [phone] Fax: [phone]   Patient ID: Carrie Pham, female    DOB: 06-06-1960, 63 y.o.   MRN: [id number]  This visit was conducted in person.  BP 118/66   Pulse 83   Temp 97.9 F (36.6 C) (Oral)   Ht 5' 5.25" (1.657 m)   Wt 158 lb 6 oz (71.8 kg)   SpO2 97%   BMI 26.15 kg/m    CC: 6 mo DM f/u visit  Subjective:   HPI: Carrie Pham is a 63 y.o. female presenting on 03/28/2023 for Medical Management of Chronic Issues (Here for 6 mo DM f/u.)   Works at [employer].   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   25 lb weight loss since starting ozempic 08/2022. Her goal is 130 lbs. Notes some constipation managed with PRN miralax with benefit. No epigastric pain, no throat mass/swelling.   Vitamin B12 deficiency - she is taking both regularly with good effect. Last b12 shot was 12/2022. Notes ongoing BUE paresthesias/numbness to hands/forearms, worse in am after awakening. Has not tried b1 replacement yet.   DM - does not regularly check sugars. Compliant with antihyperglycemic regimen which includes: metformin 500mg  daily, ozempic 1mg  weekly. Denies low sugars or hypoglycemic symptoms. Denies blurry vision. See above re paresthesias. Last diabetic eye exam: DUE. Glucometer brand: doesn't have one. Last foot exam: due. DSME: has not completed. Lab Results  Component Value Date   HGBA1C 5.7 (A) 03/28/2023   Diabetic Foot Exam - Simple   Simple Foot Form Diabetic Foot exam was performed with the following findings: Yes 03/28/2023  3:24 PM  Visual Inspection No deformities, no ulcerations, no other skin breakdown bilaterally: Yes Sensation Testing See comments: Yes Pulse Check See comments: Yes Comments No claudication Mildly diminished pedal pulses Mildly diminished sensation to monofilament testing R sole    Lab Results  Component Value Date   MICROALBUR 1.4 05/17/2022     S/p cataract removal and new lens placement (2021)     Relevant  past medical, surgical, family and social history reviewed and updated as indicated. Interim medical history since our last visit reviewed. Allergies and medications reviewed and updated. Outpatient Medications Prior to Visit  Medication Sig Dispense Refill   celecoxib (CELEBREX) 200 MG capsule Take 1 capsule (200 mg total) by mouth 2 (two) times daily. 90 capsule 2   citalopram (CELEXA) 20 MG tablet Take 1 tablet (20 mg total) by mouth daily. 90 tablet 4   cyanocobalamin (VITAMIN B12) 1000 MCG tablet Take 1 tablet (1,000 mcg total) by mouth daily.     fluconazole (DIFLUCAN) 100 MG tablet Take 100 mg by mouth daily as needed. As needed for yeast infection     levothyroxine (SYNTHROID) 125 MCG tablet TAKE 1 TABLET BY MOUTH ONCE DAILY IN THE MORNING BEFORE BREAKFAST 90 tablet 2   Melatonin 10 MG TABS Take 1 tablet by mouth at bedtime as needed.     metFORMIN (GLUCOPHAGE) 500 MG tablet Take 1 tablet (500 mg total) by mouth daily with breakfast. 90 tablet 2   metoprolol tartrate (LOPRESSOR) 50 MG tablet Take 1 tablet (50 mg total) by mouth 2 (two) times daily. 180 tablet 4   thiamine (VITAMIN B1) 100 MG tablet Take 1 tablet (100 mg total) by mouth once a week.     cyanocobalamin (VITAMIN B12) 1000 MCG/ML injection Inject 1 mL (1,000 mcg total) into the muscle every 30 (thirty) days.     Semaglutide,  1 MG/DOSE, 4 MG/3ML SOPN Inject 1 mg as directed once a week. 3 mL 3   No facility-administered medications prior to visit.     Per HPI unless specifically indicated in ROS section below Review of Systems  Objective:  BP 118/66   Pulse 83   Temp 97.9 F (36.6 C) (Oral)   Ht 5' 5.25" (1.657 m)   Wt 158 lb 6 oz (71.8 kg)   SpO2 97%   BMI 26.15 kg/m   Wt Readings from Last 3 Encounters:  03/28/23 158 lb 6 oz (71.8 kg)  09/07/22 183 lb (83 kg)  05/24/22 178 lb 2 oz (80.8 kg)      Physical Exam Vitals and nursing note reviewed.  Constitutional:      Appearance: Normal appearance. She is  not ill-appearing.  Eyes:     Extraocular Movements: Extraocular movements intact.     Conjunctiva/sclera: Conjunctivae normal.     Pupils: Pupils are equal, round, and reactive to light.  Cardiovascular:     Rate and Rhythm: Normal rate and regular rhythm.     Pulses: Normal pulses.     Heart sounds: Normal heart sounds. No murmur heard. Pulmonary:     Effort: Pulmonary effort is normal. No respiratory distress.     Breath sounds: Normal breath sounds. No wheezing, rhonchi or rales.  Musculoskeletal:     Right lower leg: No edema.     Left lower leg: No edema.  Skin:    General: Skin is warm and dry.     Findings: No rash.  Neurological:     Mental Status: She is alert.     Comments:  Neg tinel bilaterally Mildly positive phalen  Psychiatric:        Mood and Affect: Mood normal.        Behavior: Behavior normal.       Results for orders placed or performed in visit on 03/28/23  POCT glycosylated hemoglobin (Hb A1C)   Collection Time: 03/28/23  3:15 PM  Result Value Ref Range   Hemoglobin A1C 5.7 (A) 4.0 - 5.6 %   HbA1c POC (<> result, manual entry)     HbA1c, POC (prediabetic range)     HbA1c, POC (controlled diabetic range)     Assessment & Plan:   Problem List Items Addressed This Visit     Type 2 diabetes mellitus with other specified complication (HCC) - Primary   Chronic, well controlled on metformin 500mg  daily as well as ozempic 1mg  weekly.  25 lb weight loss since ozempic start. Overall tolerating medication well.  Desires to try higher ozempic dose - will send 2mg  weekly. Reviewed side effects to monitor for. Reassess control at 6 mo f/u visit  Foot exam today Over due for diabetic eye exam - encouraged she schedule this esp in setting of diabetes (r/o retinopathy) and ozempic use.       Relevant Medications   Semaglutide, 2 MG/DOSE, 8 MG/3ML SOPN   Other Relevant Orders   POCT glycosylated hemoglobin (Hb A1C) (Completed)   Overweight with body mass  index (BMI) 25.0-29.9   Congratulated on 25 lb weight loss to date (weight from 173lbs to 158lbs) Encouraged continued healthy diet choices.  She notes she'd like goal weight to be ~130lbs.  Increase ozempic as per above.       Vitamin B12 deficiency   Last b12 shot 12/2022.  She continues vitamin B12 daily.       Paresthesia of both hands  Mildly positive phalen, neg tinel.  Possible CTS component B12 is being repleted.  Has not started b1 (thiamine) vitamin replacement. Will reassess after b1 replacement.  Discussed trial wrist brace use at night.       Thiamine deficiency   Discussed starting once weekly b1 100mg        Other Visit Diagnoses       Need for influenza vaccination       Relevant Orders   Flu vaccine trivalent PF, 6mos and older(Flulaval,Afluria,Fluarix,Fluzone) (Completed)        Meds ordered this encounter  Medications   Semaglutide, 2 MG/DOSE, 8 MG/3ML SOPN    Sig: Inject 2 mg as directed once a week.    Dispense:  3 mL    Refill:  6    Note new dose    Orders Placed This Encounter  Procedures   Flu vaccine trivalent PF, 6mos and older(Flulaval,Afluria,Fluarix,Fluzone)   POCT glycosylated hemoglobin (Hb A1C)    Patient Instructions  Flu shot today Trial max ozempic dose 2mg  weekly - new pens sent to [facility] pharmacy.  Continue daily vitamin B12 replacement Start thiamine (B1) replacement 100mg  once weekly over the counter.  Return in 6 months for physical   Follow up plan: Return in about 6 months (around 09/28/2023) for annual exam, prior fasting for blood work.  Eustaquio Boyden, MD

## 2023-03-28 NOTE — Assessment & Plan Note (Addendum)
 Chronic, well controlled on metformin 500mg  daily as well as ozempic 1mg  weekly.  25 lb weight loss since ozempic start. Overall tolerating medication well.  Desires to try higher ozempic dose - will send 2mg  weekly. Reviewed side effects to monitor for. Reassess control at 6 mo f/u visit  Foot exam today Over due for diabetic eye exam - encouraged she schedule this esp in setting of diabetes (r/o retinopathy) and ozempic use.

## 2023-03-28 NOTE — Patient Instructions (Addendum)
 Flu shot today Trial max ozempic dose 2mg  weekly - new pens sent to [facility] pharmacy.  Continue daily vitamin B12 replacement Start thiamine (B1) replacement 100mg  once weekly over the counter.  Return in 6 months for physical

## 2023-05-18 LAB — HM MAMMOGRAPHY

## 2023-05-19 ENCOUNTER — Other Ambulatory Visit: Payer: 59

## 2023-05-26 ENCOUNTER — Encounter: Payer: 59 | Admitting: Family Medicine

## 2023-07-11 ENCOUNTER — Emergency Department (HOSPITAL_COMMUNITY)

## 2023-07-11 ENCOUNTER — Other Ambulatory Visit: Payer: Self-pay

## 2023-07-11 ENCOUNTER — Emergency Department (HOSPITAL_COMMUNITY)
Admission: EM | Admit: 2023-07-11 | Discharge: 2023-07-12 | Disposition: A | Discharge status: Home / Self Care | Attending: Emergency Medicine | Admitting: Emergency Medicine

## 2023-07-11 ENCOUNTER — Encounter (HOSPITAL_COMMUNITY): Payer: Self-pay

## 2023-07-11 DIAGNOSIS — Z79899 Other long term (current) drug therapy: Secondary | ICD-10-CM | POA: Insufficient documentation

## 2023-07-11 DIAGNOSIS — D649 Anemia, unspecified: Secondary | ICD-10-CM | POA: Diagnosis not present

## 2023-07-11 DIAGNOSIS — R112 Nausea with vomiting, unspecified: Secondary | ICD-10-CM | POA: Insufficient documentation

## 2023-07-11 DIAGNOSIS — I10 Essential (primary) hypertension: Secondary | ICD-10-CM | POA: Diagnosis not present

## 2023-07-11 DIAGNOSIS — Z7984 Long term (current) use of oral hypoglycemic drugs: Secondary | ICD-10-CM | POA: Insufficient documentation

## 2023-07-11 DIAGNOSIS — E039 Hypothyroidism, unspecified: Secondary | ICD-10-CM | POA: Diagnosis not present

## 2023-07-11 DIAGNOSIS — W228XXA Striking against or struck by other objects, initial encounter: Secondary | ICD-10-CM | POA: Insufficient documentation

## 2023-07-11 DIAGNOSIS — S0101XA Laceration without foreign body of scalp, initial encounter: Secondary | ICD-10-CM | POA: Insufficient documentation

## 2023-07-11 DIAGNOSIS — E1165 Type 2 diabetes mellitus with hyperglycemia: Secondary | ICD-10-CM | POA: Diagnosis not present

## 2023-07-11 DIAGNOSIS — R55 Syncope and collapse: Secondary | ICD-10-CM | POA: Insufficient documentation

## 2023-07-11 DIAGNOSIS — D72829 Elevated white blood cell count, unspecified: Secondary | ICD-10-CM | POA: Diagnosis not present

## 2023-07-11 DIAGNOSIS — S0990XA Unspecified injury of head, initial encounter: Secondary | ICD-10-CM | POA: Diagnosis present

## 2023-07-11 DIAGNOSIS — R944 Abnormal results of kidney function studies: Secondary | ICD-10-CM | POA: Diagnosis not present

## 2023-07-11 LAB — COMPREHENSIVE METABOLIC PANEL WITH GFR
ALT: 10 U/L (ref 0–44)
AST: 15 U/L (ref 15–41)
Albumin: 3.4 g/dL — ABNORMAL LOW (ref 3.5–5.0)
Alkaline Phosphatase: 58 U/L (ref 38–126)
Anion gap: 11 (ref 5–15)
BUN: 27 mg/dL — ABNORMAL HIGH (ref 8–23)
CO2: 20 mmol/L — ABNORMAL LOW (ref 22–32)
Calcium: 9.5 mg/dL (ref 8.9–10.3)
Chloride: 107 mmol/L (ref 98–111)
Creatinine, Ser: 1.32 mg/dL — ABNORMAL HIGH (ref 0.44–1.00)
GFR, Estimated: 45 mL/min — ABNORMAL LOW (ref >60)
Glucose, Bld: 125 mg/dL — ABNORMAL HIGH (ref 70–99)
Potassium: 4.4 mmol/L (ref 3.5–5.1)
Sodium: 138 mmol/L (ref 135–145)
Total Bilirubin: 0.6 mg/dL (ref 0.0–1.2)
Total Protein: 7.3 g/dL (ref 6.5–8.1)

## 2023-07-11 LAB — TROPONIN I (HIGH SENSITIVITY): Troponin I (High Sensitivity): 3 ng/L (ref <18)

## 2023-07-11 LAB — CBC WITH DIFFERENTIAL/PLATELET
Abs Immature Granulocytes: 0.06 10*3/uL (ref 0.00–0.07)
Basophils Absolute: 0 10*3/uL (ref 0.0–0.1)
Basophils Relative: 0 %
Eosinophils Absolute: 0 10*3/uL (ref 0.0–0.5)
Eosinophils Relative: 0 %
HCT: 36.6 % (ref 36.0–46.0)
Hemoglobin: 11.5 g/dL — ABNORMAL LOW (ref 12.0–15.0)
Immature Granulocytes: 1 %
Lymphocytes Relative: 10 %
Lymphs Abs: 1.2 10*3/uL (ref 0.7–4.0)
MCH: 28.7 pg (ref 26.0–34.0)
MCHC: 31.4 g/dL (ref 30.0–36.0)
MCV: 91.3 fL (ref 80.0–100.0)
Monocytes Absolute: 0.9 10*3/uL (ref 0.1–1.0)
Monocytes Relative: 8 %
Neutro Abs: 9.2 10*3/uL — ABNORMAL HIGH (ref 1.7–7.7)
Neutrophils Relative %: 81 %
Platelets: 208 10*3/uL (ref 150–400)
RBC: 4.01 MIL/uL (ref 3.87–5.11)
RDW: 12.9 % (ref 11.5–15.5)
WBC: 11.4 10*3/uL — ABNORMAL HIGH (ref 4.0–10.5)
nRBC: 0 % (ref 0.0–0.2)

## 2023-07-11 LAB — CBG MONITORING, ED: Glucose-Capillary: 132 mg/dL — ABNORMAL HIGH (ref 70–99)

## 2023-07-11 MED ORDER — ONDANSETRON HCL 4 MG/2ML IJ SOLN: 4.0000 mg | Freq: Once | INTRAMUSCULAR | Status: DC

## 2023-07-11 MED ORDER — SODIUM CHLORIDE 0.9 % IV BOLUS
1000.0000 mL | Freq: Once | INTRAVENOUS | Status: AC
Start: 2023-07-11 — End: 2023-07-12
  Administered 2023-07-11: 1000 mL via INTRAVENOUS

## 2023-07-11 MED ORDER — LACTATED RINGERS IV BOLUS
1000.0000 mL | Freq: Once | INTRAVENOUS | Status: AC
  Administered 2023-07-11: 1000 mL via INTRAVENOUS

## 2023-07-11 MED ORDER — LIDOCAINE-EPINEPHRINE-TETRACAINE (LET) TOPICAL GEL
3.0000 mL | Freq: Once | TOPICAL | Status: AC
  Administered 2023-07-11: 3 mL via TOPICAL
  Filled 2023-07-11: qty 3

## 2023-07-11 MED ORDER — ACETAMINOPHEN 500 MG PO TABS
1000.0000 mg | ORAL_TABLET | Freq: Once | ORAL | Status: AC
  Administered 2023-07-11: 1000 mg via ORAL
  Filled 2023-07-11: qty 2

## 2023-07-11 MED ORDER — ONDANSETRON HCL 4 MG/2ML IJ SOLN
4.0000 mg | Freq: Once | INTRAMUSCULAR | Status: AC
  Administered 2023-07-11: 4 mg via INTRAVENOUS
  Filled 2023-07-11: qty 2

## 2023-07-11 NOTE — ED Provider Triage Note (Signed)
 Emergency Medicine Provider Triage Evaluation Note  Carrie Pham , a 63 y.o. female  was evaluated in triage.  Pt complains of passing out. Had just had lunch with family, felt lightheaded like I was going to faint and then next remembers waking up in ambulance. +nausea/vomiting. Denies chest pain, palpitations, shortness of breath, cough, leg swelling. No h/o DVT/PE. Has passed out one other time ~9 months ago. Doesn't take a blood thinner. Patient stated Tdap is up to date. Endorses HA. Received zofran  w/ EMS.  Review of Systems  Positive: +syncope Negative: F/c, recent illnesses, chest pain/SOB  Physical Exam  BP 103/68   Pulse 92   Temp 98.1 F (36.7 C)   Resp 18   Ht 5' 6 (1.676 m)   Wt 67.1 kg   SpO2 100%   BMI 23.89 kg/m  Gen:   Awake, no distress   Resp:  Normal effort  MSK:   Moves extremities without difficulty  Other:  3 cm posterior head laceration, very mild venous oozing, no arterial bleeding.  Medical Decision Making  Medically screening exam initiated at 4:41 PM.  Appropriate orders placed.  Carrie Pham was informed that the remainder of the evaluation will be completed by another provider, this initial triage assessment does not replace that evaluation, and the importance of remaining in the ED until their evaluation is complete.  Labs/imaging ordered. Head wound dressed w/ sterile gauze.    Franklyn Sid SAILOR, MD 07/11/23 [id number]

## 2023-07-11 NOTE — ED Provider Notes (Signed)
 [facility] EMERGENCY DEPARTMENT AT [facility] Provider Note   CSN: [id number] Arrival date & time: 07/11/23  1605     Patient presents with: Loss of Consciousness   Carrie Pham is a 63 y.o. female with medical history of arthritis, fibromyalgia, hypothyroid, hypertension, diabetes.  Patient presents to ED for evaluation of syncope.  Patient reports that over the last 3 days she has been having nausea without vomiting.  Reports that she has not been having diarrhea.  States that she went to lunch with her sister and mother.  Reports that they then went to department store and were shopping.  The patient reports that she went to the bathroom and urinated and also had a bowel movement.  States that she then came back to continue shopping with her mother and about 10 minutes later was standing in line waiting to check out when she became all of a sudden very diaphoretic and sweaty, then blacked out and fell onto the floor.  Patient mother and sister report the patient began having vomiting.  Patient was unconscious for about 2 minutes before reading consciousness.  She reports that the next extremities was waking up to bystanders surrounding her.  She denies any preceding chest pain or shortness of breath prior to this event.  She denies any lightheadedness, dizziness or weakness.  She was not confused upon waking.  She denies a history of seizures.  Denies drug or alcohol use today.  She reports a history of a similar event in February.  Reports that she had also gone to the bathroom at this time.  She states that she has had no recent fevers, diarrhea.  Denies any preceding chest pain or shortness of breath.  Denies any current chest pain or shortness of breath.    Loss of Consciousness      Prior to Admission medications   Medication Sig Start Date End Date Taking? Authorizing Provider  celecoxib  (CELEBREX ) 200 MG capsule Take 1 capsule (200 mg total) by mouth 2 (two) times daily.  08/18/22   Rilla Baller, MD  citalopram  (CELEXA ) 20 MG tablet Take 1 tablet (20 mg total) by mouth daily. 05/24/22   Rilla Baller, MD  cyanocobalamin  (VITAMIN B12) 1000 MCG tablet Take 1 tablet (1,000 mcg total) by mouth daily. 05/25/22   Rilla Baller, MD  fluconazole (DIFLUCAN) 100 MG tablet Take 100 mg by mouth daily as needed. As needed for yeast infection    [provider]  levothyroxine  (SYNTHROID ) 125 MCG tablet TAKE 1 TABLET BY MOUTH ONCE DAILY IN THE MORNING BEFORE BREAKFAST 08/18/22   Rilla Baller, MD  Melatonin 10 MG TABS Take 1 tablet by mouth at bedtime as needed. 05/24/22   Rilla Baller, MD  metFORMIN  (GLUCOPHAGE ) 500 MG tablet Take 1 tablet (500 mg total) by mouth daily with breakfast. 08/18/22   Rilla Baller, MD  metoprolol  tartrate (LOPRESSOR ) 50 MG tablet Take 1 tablet (50 mg total) by mouth 2 (two) times daily. 05/24/22   Rilla Baller, MD  Semaglutide , 2 MG/DOSE, 8 MG/3ML SOPN Inject 2 mg as directed once a week. 03/28/23   Rilla Baller, MD  thiamine  (VITAMIN B1) 100 MG tablet Take 1 tablet (100 mg total) by mouth once a week. 09/16/22   Rilla Baller, MD    Allergies: Codeine    Review of Systems  Cardiovascular:  Positive for syncope.    Updated Vital Signs BP 127/79 (BP Location: Right Arm)   Pulse 86   Temp 98.5 F (  36.9 C) (Oral)   Resp 12   Ht 5' 6 (1.676 m)   Wt 67.1 kg   SpO2 100%   BMI 23.89 kg/m   Physical Exam  (all labs ordered are listed, but only abnormal results are displayed) Labs Reviewed  CBC WITH DIFFERENTIAL/PLATELET - Abnormal; Notable for the following components:      Result Value   WBC 11.4 (*)    Hemoglobin 11.5 (*)    Neutro Abs 9.2 (*)    All other components within normal limits  COMPREHENSIVE METABOLIC PANEL WITH GFR - Abnormal; Notable for the following components:   CO2 20 (*)    Glucose, Bld 125 (*)    BUN 27 (*)    Creatinine, Ser 1.32 (*)    Albumin 3.4 (*)    GFR, Estimated 45 (*)     All other components within normal limits  CBG MONITORING, ED - Abnormal; Notable for the following components:   Glucose-Capillary 132 (*)    All other components within normal limits  URINALYSIS, ROUTINE W REFLEX MICROSCOPIC  TROPONIN I (HIGH SENSITIVITY)  TROPONIN I (HIGH SENSITIVITY)    EKG: EKG Interpretation Date/Time:  Tuesday July 11 2023 22:47:12 EDT Ventricular Rate:  91 PR Interval:  151 QRS Duration:  92 QT Interval:  342 QTC Calculation: 421 R Axis:   74  Text Interpretation: Sinus rhythm Confirmed by Franklyn Gills [phone]) on 07/11/2023 10:53:20 PM  Radiology: CT Head Wo Contrast Result Date: 07/11/2023 CLINICAL DATA:  Head trauma, moderate-severe EXAM: CT HEAD WITHOUT CONTRAST TECHNIQUE: Contiguous axial images were obtained from the base of the skull through the vertex without intravenous contrast. RADIATION DOSE REDUCTION: This exam was performed according to the departmental dose-optimization program which includes automated exposure control, adjustment of the mA and/or kV according to patient size and/or use of iterative reconstruction technique. COMPARISON:  CT of the head dated March 17, 2022. FINDINGS: Brain: Age-related atrophy and mild periventricular white matter disease. No evidence of acute intracranial injury. No evidence of hemorrhage, mass, cortical infarct or hydrocephalus. Vascular: Negative. Skull: Intact. 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 No suspicious lesions are present. Sinuses/Orbits: Status post bilateral lens replacement. The visualized paranasal sinuses are clear. Other: None. IMPRESSION: 1. No evidence of acute traumatic injury. 2. Mild periventricular white matter disease. Electronically Signed   By: Evalene Coho M.D.   On: 07/11/2023 18:37    {Document cardiac monitor, telemetry assessment procedure when appropriate:32947} Procedures   Medications Ordered in the ED   ondansetron  (ZOFRAN ) injection 4 mg (has no administration in time range)  sodium chloride  0.9 % bolus 1,000 mL (has no administration in time range)  acetaminophen  (TYLENOL ) tablet 1,000 mg (has no administration in time range)  lidocaine-EPINEPHrine-tetracaine (LET) topical gel (has no administration in time range)  lactated ringers  bolus 1,000 mL (0 mLs Intravenous Stopped 07/11/23 1803)      {Click here for ABCD2, HEART and other calculators REFRESH Note before signing:1}                              Medical Decision Making Amount and/or Complexity of Data Reviewed Labs: ordered. Radiology: ordered.  Risk OTC drugs. Prescription drug management.   ***  {Document critical care time when appropriate  Document review of labs and clinical decision tools ie CHADS2VASC2, etc  Document your independent review of radiology images and any  outside records  Document your discussion with family members, caretakers and with consultants  Document social determinants of health affecting pt's care  Document your decision making why or why not admission, treatments were needed:32947:::1}   Final diagnoses:  None    ED Discharge Orders     None

## 2023-07-11 NOTE — ED Triage Notes (Signed)
 Pt had a syncope event, pt felt dizzy and hit head. No blood thinners. Pt had LOC and hit head. Pt has small laceration to back of head. Pt woke up and vomited from fall. N/v/d for 2 days. EMS gave 4mg  of zofran  and 500 cc of NS.

## 2023-07-12 ENCOUNTER — Telehealth: Payer: Self-pay | Admitting: Family Medicine

## 2023-07-12 LAB — TROPONIN I (HIGH SENSITIVITY): Troponin I (High Sensitivity): 3 ng/L (ref <18)

## 2023-07-12 NOTE — Discharge Instructions (Signed)
 It was a pleasure taking part in your care.  As discussed, your workup is largely reassuring.  We repaired the wound to the back of your head.  Please follow-up with your PCP in 10 days for removal of staple.  Please do not submerge your head in the water  in the next 10 days.  He may take showers.  For your elevated creatinine, please follow-up with your PCP in 3 to 5 days for recheck of creatinine.  Please continue to hydrate yourself at home.  Return to the ED with any new or worsening symptoms like diarrhea, nausea or vomiting.

## 2023-07-12 NOTE — Telephone Encounter (Signed)
 Pt was added to schedule.

## 2023-07-12 NOTE — Telephone Encounter (Signed)
 Spoke with pt offering 07/18/23 at 2:00 for ER f/u. Pt agrees. Will have pt added to schedule.    Plz add pt to schedule.

## 2023-07-12 NOTE — Telephone Encounter (Signed)
 Please advise when we may add this patient to your schedule ED Recommends 3-5 days   Copied from CRM [id number]. Topic: Appointments - Scheduling Inquiry for Clinic >> Jul 12, 2023 12:57 PM Donna BRAVO wrote: Reason for CRM: Patient calling to schedule ED follow up appt patient was in ED on 07/11/23  patient requesting appt before Friday 07/14/23  Please call patient to schedule appt [phone]

## 2023-07-18 ENCOUNTER — Ambulatory Visit (INDEPENDENT_AMBULATORY_CARE_PROVIDER_SITE_OTHER): Admitting: Family Medicine

## 2023-07-18 ENCOUNTER — Encounter: Payer: Self-pay | Admitting: Family Medicine

## 2023-07-18 VITALS — BP 118/80 | HR 91 | Temp 98.2°F | Ht 66.0 in | Wt 153.1 lb

## 2023-07-18 DIAGNOSIS — E039 Hypothyroidism, unspecified: Secondary | ICD-10-CM | POA: Diagnosis not present

## 2023-07-18 DIAGNOSIS — R55 Syncope and collapse: Secondary | ICD-10-CM

## 2023-07-18 DIAGNOSIS — Z23 Encounter for immunization: Secondary | ICD-10-CM | POA: Diagnosis not present

## 2023-07-18 DIAGNOSIS — E519 Thiamine deficiency, unspecified: Secondary | ICD-10-CM

## 2023-07-18 DIAGNOSIS — E1169 Type 2 diabetes mellitus with other specified complication: Secondary | ICD-10-CM | POA: Diagnosis not present

## 2023-07-18 DIAGNOSIS — E538 Deficiency of other specified B group vitamins: Secondary | ICD-10-CM

## 2023-07-18 DIAGNOSIS — Z8679 Personal history of other diseases of the circulatory system: Secondary | ICD-10-CM

## 2023-07-18 DIAGNOSIS — S0101XA Laceration without foreign body of scalp, initial encounter: Secondary | ICD-10-CM

## 2023-07-18 DIAGNOSIS — M797 Fibromyalgia: Secondary | ICD-10-CM | POA: Diagnosis not present

## 2023-07-18 MED ORDER — ONDANSETRON HCL 4 MG PO TABS: 4.0000 mg | ORAL_TABLET | Freq: Three times a day (TID) | ORAL | 0 refills | Status: AC | PRN

## 2023-07-18 MED ORDER — MELATONIN 5 MG PO TABS: 5.0000 mg | ORAL_TABLET | Freq: Every evening | ORAL | Status: AC | PRN

## 2023-07-18 NOTE — Progress Notes (Unsigned)
 Ph: [phone] Fax: [phone]   Patient ID: Carrie Pham, female    DOB: 1960-12-17, 63 y.o.   MRN: [id number]  This visit was conducted in person.  BP 118/80 (Patient Position: Standing)   Pulse 91   Temp 98.2 F (36.8 C) (Oral)   Ht 5' 6 (1.676 m)   Wt 153 lb 2 oz (69.5 kg)   SpO2 97%   BMI 24.72 kg/m    Orthostatic Vitals for the past 48 hrs (Last 6 readings):  Patient Position Orthostatic BP BP Pulse  07/18/23 1359 -- -- 118/68 91  07/18/23 1436 Supine 124/70 -- --  07/18/23 1445 Standing -- 118/80 --   CC: ER f/u visit  Subjective:   HPI: Carrie Pham is a 63 y.o. female presenting on 07/18/2023 for Hospitalization Follow-up (Seen on 07/11/23 at [facility] ED, dx syncope and collapse. )   Seen at ER on 07/11/2023 after episode of syncope preceded by 3 days of nausea without vomiting or diarrhea. Syncopal episode happened a few minutes after BM/urination. Afterwards had episode of vomiting.   Posterior scalp laceration was repaired with 1 staple.   Tetanus not updated - as pt stated she was up to date but I don't see recent documentation for tetanus vaccine.   Head CT at ER unrevealing - mild periventricular white matter disease.   She notes some vertigo last night.  She states she also had a syncopal episode 02/2022 attributed to vasovagal episode.   H/o SVT remotely seen at ER for this (2011).      Relevant past medical, surgical, family and social history reviewed and updated as indicated. Interim medical history since our last visit reviewed. Allergies and medications reviewed and updated. Outpatient Medications Prior to Visit  Medication Sig Dispense Refill   citalopram  (CELEXA ) 20 MG tablet Take 1 tablet (20 mg total) by mouth daily. 90 tablet 4   cyanocobalamin  (VITAMIN B12) 1000 MCG tablet Take 1 tablet (1,000 mcg total) by mouth daily.     fluconazole (DIFLUCAN) 100 MG tablet Take 100 mg by mouth daily as needed. As needed for yeast infection      levothyroxine  (SYNTHROID ) 125 MCG tablet TAKE 1 TABLET BY MOUTH ONCE DAILY IN THE MORNING BEFORE BREAKFAST 90 tablet 2   metFORMIN  (GLUCOPHAGE ) 500 MG tablet Take 1 tablet (500 mg total) by mouth daily with breakfast. 90 tablet 2   metoprolol  tartrate (LOPRESSOR ) 50 MG tablet Take 1 tablet (50 mg total) by mouth 2 (two) times daily. 180 tablet 4   Semaglutide , 2 MG/DOSE, 8 MG/3ML SOPN Inject 2 mg as directed once a week. 3 mL 6   thiamine  (VITAMIN B1) 100 MG tablet Take 1 tablet (100 mg total) by mouth once a week.     celecoxib  (CELEBREX ) 200 MG capsule Take 1 capsule (200 mg total) by mouth 2 (two) times daily. 90 capsule 2   Melatonin 10 MG TABS Take 1 tablet by mouth at bedtime as needed.     celecoxib  (CELEBREX ) 200 MG capsule Take 1 capsule (200 mg total) by mouth daily.     No facility-administered medications prior to visit.     Per HPI unless specifically indicated in ROS section below Review of Systems  Objective:  BP 118/80 (Patient Position: Standing)   Pulse 91   Temp 98.2 F (36.8 C) (Oral)   Ht 5' 6 (1.676 m)   Wt 153 lb 2 oz (69.5 kg)   SpO2 97%   BMI 24.72 kg/m  Wt Readings from Last 3 Encounters:  07/18/23 153 lb 2 oz (69.5 kg)  07/11/23 148 lb (67.1 kg)  03/28/23 158 lb 6 oz (71.8 kg)      Physical Exam    Results for orders placed or performed during the hospital encounter of 07/11/23  CBC with Differential   Collection Time: 07/11/23  4:42 PM  Result Value Ref Range   WBC 11.4 (H) 4.0 - 10.5 K/uL   RBC 4.01 3.87 - 5.11 MIL/uL   Hemoglobin 11.5 (L) 12.0 - 15.0 g/dL   HCT 63.3 63.9 - 53.9 %   MCV 91.3 80.0 - 100.0 fL   MCH 28.7 26.0 - 34.0 pg   MCHC 31.4 30.0 - 36.0 g/dL   RDW 87.0 88.4 - 84.4 %   Platelets 208 150 - 400 K/uL   nRBC 0.0 0.0 - 0.2 %   Neutrophils Relative % 81 %   Neutro Abs 9.2 (H) 1.7 - 7.7 K/uL   Lymphocytes Relative 10 %   Lymphs Abs 1.2 0.7 - 4.0 K/uL   Monocytes Relative 8 %   Monocytes Absolute 0.9 0.1 - 1.0 K/uL    Eosinophils Relative 0 %   Eosinophils Absolute 0.0 0.0 - 0.5 K/uL   Basophils Relative 0 %   Basophils Absolute 0.0 0.0 - 0.1 K/uL   Immature Granulocytes 1 %   Abs Immature Granulocytes 0.06 0.00 - 0.07 K/uL  Comprehensive metabolic panel   Collection Time: 07/11/23  4:42 PM  Result Value Ref Range   Sodium 138 135 - 145 mmol/L   Potassium 4.4 3.5 - 5.1 mmol/L   Chloride 107 98 - 111 mmol/L   CO2 20 (L) 22 - 32 mmol/L   Glucose, Bld 125 (H) 70 - 99 mg/dL   BUN 27 (H) 8 - 23 mg/dL   Creatinine, Ser 8.67 (H) 0.44 - 1.00 mg/dL   Calcium 9.5 8.9 - 89.6 mg/dL   Total Protein 7.3 6.5 - 8.1 g/dL   Albumin 3.4 (L) 3.5 - 5.0 g/dL   AST 15 15 - 41 U/L   ALT 10 0 - 44 U/L   Alkaline Phosphatase 58 38 - 126 U/L   Total Bilirubin 0.6 0.0 - 1.2 mg/dL   GFR, Estimated 45 (L) >60 mL/min   Anion gap 11 5 - 15  Troponin I (High Sensitivity)   Collection Time: 07/11/23  4:42 PM  Result Value Ref Range   Troponin I (High Sensitivity) 3 <18 ng/L  POC CBG, ED   Collection Time: 07/11/23  5:05 PM  Result Value Ref Range   Glucose-Capillary 132 (H) 70 - 99 mg/dL  Troponin I (High Sensitivity)   Collection Time: 07/12/23  1:13 AM  Result Value Ref Range   Troponin I (High Sensitivity) 3 <18 ng/L   Lab Results  Component Value Date   HGBA1C 5.7 (A) 03/28/2023   Lab Results  Component Value Date   TSH 2.18 05/17/2022    Assessment & Plan:   Problem List Items Addressed This Visit     Type 2 diabetes mellitus with other specified complication (HCC)   Relevant Orders   Hemoglobin A1c   Fibromyalgia   Relevant Medications   celecoxib  (CELEBREX ) 200 MG capsule   Hypothyroidism   Relevant Orders   TSH   Syncope - Primary   Relevant Orders   CBC with Differential/Platelet   Basic metabolic panel with GFR   Vitamin B12 deficiency   Relevant Orders   Vitamin B12  Thiamine  deficiency   Relevant Orders   Vitamin B1     Meds ordered this encounter  Medications   ondansetron   (ZOFRAN ) 4 MG tablet    Sig: Take 1 tablet (4 mg total) by mouth every 8 (eight) hours as needed for nausea or vomiting.    Dispense:  20 tablet    Refill:  0   melatonin 5 MG TABS    Sig: Take 1 tablet (5 mg total) by mouth at bedtime as needed.    Orders Placed This Encounter  Procedures   CBC with Differential/Platelet   Basic metabolic panel with GFR   Hemoglobin A1c   TSH   Vitamin B12   Vitamin B1    Patient Instructions  Zofran  as needed for nausea.  Update Tetanus shot today Labs today I will order repeat heart monitor and refer you to cardiology.  Pending results we may refer you to neurologist.  Limit driving.   Return Monday July 7th for staple removal at 4:30pm.   Follow up plan: Return if symptoms worsen or fail to improve.  Anton Blas, MD

## 2023-07-18 NOTE — Patient Instructions (Addendum)
 Zofran  as needed for nausea.  Update Tetanus shot today Labs today I will order repeat heart monitor and refer you to cardiology.  Pending results we may refer you to neurologist.  Limit driving.   Return Monday July 7th for staple removal at 4:30pm.

## 2023-07-19 LAB — HEMOGLOBIN A1C: Hgb A1c MFr Bld: 5.8 % (ref 4.6–6.5)

## 2023-07-19 LAB — CBC WITH DIFFERENTIAL/PLATELET
Basophils Absolute: 0.1 10*3/uL (ref 0.0–0.1)
Basophils Relative: 1.1 % (ref 0.0–3.0)
Eosinophils Absolute: 0.1 10*3/uL (ref 0.0–0.7)
Eosinophils Relative: 1.5 % (ref 0.0–5.0)
HCT: 37.4 % (ref 36.0–46.0)
Hemoglobin: 12.3 g/dL (ref 12.0–15.0)
Lymphocytes Relative: 26.7 % (ref 12.0–46.0)
Lymphs Abs: 2 10*3/uL (ref 0.7–4.0)
MCHC: 32.9 g/dL (ref 30.0–36.0)
MCV: 87.4 fl (ref 78.0–100.0)
Monocytes Absolute: 0.4 10*3/uL (ref 0.1–1.0)
Monocytes Relative: 5.5 % (ref 3.0–12.0)
Neutro Abs: 5 10*3/uL (ref 1.4–7.7)
Neutrophils Relative %: 65.2 % (ref 43.0–77.0)
Platelets: 278 10*3/uL (ref 150.0–400.0)
RBC: 4.28 Mil/uL (ref 3.87–5.11)
RDW: 13.1 % (ref 11.5–15.5)
WBC: 7.6 10*3/uL (ref 4.0–10.5)

## 2023-07-19 LAB — BASIC METABOLIC PANEL WITH GFR
BUN: 19 mg/dL (ref 6–23)
CO2: 26 meq/L (ref 19–32)
Calcium: 10 mg/dL (ref 8.4–10.5)
Chloride: 105 meq/L (ref 96–112)
Creatinine, Ser: 0.88 mg/dL (ref 0.40–1.20)
GFR: 69.79 mL/min (ref >60.00)
Glucose, Bld: 105 mg/dL — ABNORMAL HIGH (ref 70–99)
Potassium: 4.4 meq/L (ref 3.5–5.1)
Sodium: 139 meq/L (ref 135–145)

## 2023-07-19 LAB — VITAMIN B12: Vitamin B-12: 203 pg/mL — ABNORMAL LOW (ref 211–911)

## 2023-07-19 LAB — TSH: TSH: 1.66 u[IU]/mL (ref 0.35–5.50)

## 2023-07-20 ENCOUNTER — Ambulatory Visit: Attending: Family Medicine

## 2023-07-20 ENCOUNTER — Ambulatory Visit: Payer: Self-pay | Admitting: Family Medicine

## 2023-07-20 ENCOUNTER — Telehealth: Payer: Self-pay | Admitting: Family Medicine

## 2023-07-20 DIAGNOSIS — R55 Syncope and collapse: Secondary | ICD-10-CM

## 2023-07-20 DIAGNOSIS — S0101XA Laceration without foreign body of scalp, initial encounter: Secondary | ICD-10-CM | POA: Insufficient documentation

## 2023-07-20 DIAGNOSIS — Z8679 Personal history of other diseases of the circulatory system: Secondary | ICD-10-CM

## 2023-07-20 DIAGNOSIS — S0101XD Laceration without foreign body of scalp, subsequent encounter: Secondary | ICD-10-CM | POA: Insufficient documentation

## 2023-07-20 NOTE — Assessment & Plan Note (Addendum)
 Still tender to scalp, 7 days staple present - will return Monday for staple removal.  Update Tdap today

## 2023-07-20 NOTE — Assessment & Plan Note (Signed)
 She continues celebrex  200mg  once daily and celexa  daily

## 2023-07-20 NOTE — Addendum Note (Signed)
 Addended by: HOPE VEVA PARAS on: 07/20/2023 09:39 AM   Modules accepted: Orders

## 2023-07-20 NOTE — Assessment & Plan Note (Signed)
 Chronic, well controlled.  Update A1c, consider discontinuing metformin .  Doubt hypoglycemia related.

## 2023-07-20 NOTE — Assessment & Plan Note (Signed)
 Update levels on weekly oral replacement.

## 2023-07-20 NOTE — Assessment & Plan Note (Signed)
 ER records reviewed.  No signs of hypoglycemia. Orthostatics normal today in office - doubt hypotension related.  H/o SVT but denies recent tachy-palpitations.  Possibly vasovagal episode in setting of malaise and GI symptoms for several days prior, however given recurrent episode do recommend further evaluation - will order heart monitor and refer to cardiology/neurology.  Drop melatonin from 10mg  to 5mg .  See below re: diabetes management.  Recommend limiting driving until further evaluation completed.

## 2023-07-20 NOTE — Assessment & Plan Note (Signed)
 Update levels on daily oral replacement

## 2023-07-20 NOTE — Telephone Encounter (Signed)
 LVM for patient to call and speak to anyone in the lab

## 2023-07-20 NOTE — Assessment & Plan Note (Addendum)
 Update levels after recent syncopal episode.  She continues levothyroxine  125mcg daily.

## 2023-07-22 LAB — VITAMIN B1: Vitamin B1 (Thiamine): 8 nmol/L (ref 8–30)

## 2023-07-24 ENCOUNTER — Ambulatory Visit: Admitting: Family Medicine

## 2023-07-24 ENCOUNTER — Encounter: Payer: Self-pay | Admitting: Family Medicine

## 2023-07-24 VITALS — BP 118/68 | HR 86 | Temp 98.2°F | Ht 66.0 in | Wt 152.0 lb

## 2023-07-24 DIAGNOSIS — S0101XD Laceration without foreign body of scalp, subsequent encounter: Secondary | ICD-10-CM | POA: Diagnosis not present

## 2023-07-24 DIAGNOSIS — Z7985 Long-term (current) use of injectable non-insulin antidiabetic drugs: Secondary | ICD-10-CM

## 2023-07-24 DIAGNOSIS — E519 Thiamine deficiency, unspecified: Secondary | ICD-10-CM | POA: Diagnosis not present

## 2023-07-24 DIAGNOSIS — E1169 Type 2 diabetes mellitus with other specified complication: Secondary | ICD-10-CM | POA: Diagnosis not present

## 2023-07-24 DIAGNOSIS — E538 Deficiency of other specified B group vitamins: Secondary | ICD-10-CM

## 2023-07-24 DIAGNOSIS — R55 Syncope and collapse: Secondary | ICD-10-CM

## 2023-07-24 MED ORDER — CYANOCOBALAMIN 1000 MCG/ML IJ SOLN
1000.0000 ug | Freq: Once | INTRAMUSCULAR | Status: AC
  Administered 2023-07-24: 1000 ug via INTRAMUSCULAR

## 2023-07-24 NOTE — Assessment & Plan Note (Signed)
 Pending Zio patch, cardiology and neurology eval.

## 2023-07-24 NOTE — Assessment & Plan Note (Signed)
 Latest A1c great control - stop metformin . Continue ozempic  2mg  weekly.

## 2023-07-24 NOTE — Assessment & Plan Note (Signed)
 Levels remain low despite daily oral replacement.  B12 shot today. Will recommend continuing monthly B12 shots. Now off metformin .  Consider IF Ab test next labwork.

## 2023-07-24 NOTE — Patient Instructions (Addendum)
 B12 shot today - continue daily b12 over the counter Take thamine b1 vitamin a few times a week.  Staple removed - scab should slowly fall off.  May wash but don't scrub area.

## 2023-07-24 NOTE — Assessment & Plan Note (Signed)
 Staple removed, pt tolerated well.

## 2023-07-24 NOTE — Progress Notes (Signed)
 Ph: [phone] Fax: [phone]   Patient ID: Carrie Pham, female    DOB: [birth year], 63 y.o.   MRN: [id number]  This visit was conducted in person.  BP 118/68   Pulse 86   Temp 98.2 F (36.8 C) (Oral)   Ht 5' 6 (1.676 m)   Wt 152 lb (68.9 kg)   SpO2 97%   BMI 24.53 kg/m    CC: staple removal Subjective:   HPI: Carrie Pham is a 63 y.o. female presenting on 07/24/2023 for Suture / Staple Removal (Here for suture removal. )   See prior notes for details.  Here for staple removal.  Pending zio monitor, cardiology eval, neurology eval for recurrent syncope.   We stopped metformin  with A1c 5.8% - she continues ozempic  2mg  weekly.      Relevant past medical, surgical, family and social history reviewed and updated as indicated. Interim medical history since our last visit reviewed. Allergies and medications reviewed and updated. Outpatient Medications Prior to Visit  Medication Sig Dispense Refill   celecoxib  (CELEBREX ) 200 MG capsule Take 1 capsule (200 mg total) by mouth daily.     citalopram  (CELEXA ) 20 MG tablet Take 1 tablet (20 mg total) by mouth daily. 90 tablet 4   cyanocobalamin  (VITAMIN B12) 1000 MCG tablet Take 1 tablet (1,000 mcg total) by mouth daily.     fluconazole (DIFLUCAN) 100 MG tablet Take 100 mg by mouth daily as needed. As needed for yeast infection     levothyroxine  (SYNTHROID ) 125 MCG tablet TAKE 1 TABLET BY MOUTH ONCE DAILY IN THE MORNING BEFORE BREAKFAST 90 tablet 2   melatonin 5 MG TABS Take 1 tablet (5 mg total) by mouth at bedtime as needed.     metoprolol  tartrate (LOPRESSOR ) 50 MG tablet Take 1 tablet (50 mg total) by mouth 2 (two) times daily. 180 tablet 4   ondansetron  (ZOFRAN ) 4 MG tablet Take 1 tablet (4 mg total) by mouth every 8 (eight) hours as needed for nausea or vomiting. 20 tablet 0   Semaglutide , 2 MG/DOSE, 8 MG/3ML SOPN Inject 2 mg as directed once a week. 3 mL 6   thiamine  (VITAMIN B1) 100 MG tablet Take 1 tablet (100 mg  total) by mouth once a week.     No facility-administered medications prior to visit.     Per HPI unless specifically indicated in ROS section below Review of Systems  Objective:  BP 118/68   Pulse 86   Temp 98.2 F (36.8 C) (Oral)   Ht 5' 6 (1.676 m)   Wt 152 lb (68.9 kg)   SpO2 97%   BMI 24.53 kg/m   Wt Readings from Last 3 Encounters:  07/24/23 152 lb (68.9 kg)  07/18/23 153 lb 2 oz (69.5 kg)  07/11/23 148 lb (67.1 kg)      Physical Exam Vitals and nursing note reviewed.  Constitutional:      Appearance: Normal appearance. She is not ill-appearing.  HENT:     Head:      Comments: Staple x1 removed from left posterior scalp, pt tolerated well. Scab to area remains.  Neurological:     Mental Status: She is alert.       Results for orders placed or performed in visit on 07/18/23  CBC with Differential/Platelet   Collection Time: 07/18/23  2:46 PM  Result Value Ref Range   WBC 7.6 4.0 - 10.5 K/uL   RBC 4.28 3.87 - 5.11 Mil/uL   Hemoglobin  12.3 12.0 - 15.0 g/dL   HCT 62.5 63.9 - 53.9 %   MCV 87.4 78.0 - 100.0 fl   MCHC 32.9 30.0 - 36.0 g/dL   RDW 86.8 88.4 - 84.4 %   Platelets 278.0 150.0 - 400.0 K/uL   Neutrophils Relative % 65.2 43.0 - 77.0 %   Lymphocytes Relative 26.7 12.0 - 46.0 %   Monocytes Relative 5.5 3.0 - 12.0 %   Eosinophils Relative 1.5 0.0 - 5.0 %   Basophils Relative 1.1 0.0 - 3.0 %   Neutro Abs 5.0 1.4 - 7.7 K/uL   Lymphs Abs 2.0 0.7 - 4.0 K/uL   Monocytes Absolute 0.4 0.1 - 1.0 K/uL   Eosinophils Absolute 0.1 0.0 - 0.7 K/uL   Basophils Absolute 0.1 0.0 - 0.1 K/uL  Basic metabolic panel with GFR   Collection Time: 07/18/23  2:46 PM  Result Value Ref Range   Sodium 139 135 - 145 mEq/L   Potassium 4.4 3.5 - 5.1 mEq/L   Chloride 105 96 - 112 mEq/L   CO2 26 19 - 32 mEq/L   Glucose, Bld 105 (H) 70 - 99 mg/dL   BUN 19 6 - 23 mg/dL   Creatinine, Ser 9.11 0.40 - 1.20 mg/dL   GFR 30.20 >39.99 mL/min   Calcium 10.0 8.4 - 10.5 mg/dL   Hemoglobin J8r   Collection Time: 07/18/23  2:46 PM  Result Value Ref Range   Hgb A1c MFr Bld 5.8 4.6 - 6.5 %  TSH   Collection Time: 07/18/23  2:46 PM  Result Value Ref Range   TSH 1.66 0.35 - 5.50 uIU/mL  Vitamin B12   Collection Time: 07/18/23  2:46 PM  Result Value Ref Range   Vitamin B-12 203 (L) 211 - 911 pg/mL  Vitamin B1   Collection Time: 07/18/23  2:46 PM  Result Value Ref Range   Vitamin B1 (Thiamine ) 8 8 - 30 nmol/L   Lab Results  Component Value Date   FOLATE 20.9 05/17/2022   Assessment & Plan:   Problem List Items Addressed This Visit     Type 2 diabetes mellitus with other specified complication (HCC)   Latest A1c great control - stop metformin . Continue ozempic  2mg  weekly.       Syncope   Pending Zio patch, cardiology and neurology eval.       Vitamin B12 deficiency   Levels remain low despite daily oral replacement.  B12 shot today. Will recommend continuing monthly B12 shots. Now off metformin .  Consider IF Ab test next labwork.       Thiamine  deficiency   Discussed 1-2x/wk b1 thiamine  replacement.       Scalp laceration, subsequent encounter - Primary   Staple removed, pt tolerated well.         Meds ordered this encounter  Medications   cyanocobalamin  (VITAMIN B12) injection 1,000 mcg    No orders of the defined types were placed in this encounter.   Patient Instructions  B12 shot today - continue daily b12 over the counter Take thamine b1 vitamin a few times a week.  Staple removed - scab should slowly fall off.  May wash but don't scrub area.   Follow up plan: No follow-ups on file.  Anton Blas, MD

## 2023-07-24 NOTE — Assessment & Plan Note (Signed)
 Discussed 1-2x/wk b1 thiamine  replacement.

## 2023-07-31 NOTE — Telephone Encounter (Signed)
 Called pt back to ask when last b12 shot was no answer and VM is full. Called spouse said she is working but would have her call us  back.

## 2023-08-21 DIAGNOSIS — R55 Syncope and collapse: Secondary | ICD-10-CM | POA: Diagnosis not present

## 2023-08-29 ENCOUNTER — Ambulatory Visit (INDEPENDENT_AMBULATORY_CARE_PROVIDER_SITE_OTHER)

## 2023-08-29 DIAGNOSIS — E538 Deficiency of other specified B group vitamins: Secondary | ICD-10-CM

## 2023-08-29 MED ORDER — CYANOCOBALAMIN 1000 MCG/ML IJ SOLN
1000.0000 ug | Freq: Once | INTRAMUSCULAR | Status: AC
  Administered 2023-08-29 (×2): 1000 ug via INTRAMUSCULAR

## 2023-08-29 NOTE — Progress Notes (Signed)
 Per orders of Dr. Rilla, monthly injection of B12 1000 mcg/ml given by Clotilda SHAUNNA Pander, CMA in Right Deltoid. Patient tolerated injection well.  Suggested to pt that she schedule her next few monthly injections on her way out.

## 2023-10-02 ENCOUNTER — Ambulatory Visit: Admitting: Family Medicine

## 2023-10-02 ENCOUNTER — Encounter: Payer: Self-pay | Admitting: Family Medicine

## 2023-10-02 DIAGNOSIS — E119 Type 2 diabetes mellitus without complications: Secondary | ICD-10-CM

## 2023-10-02 NOTE — Progress Notes (Unsigned)
 Ph: [phone] Fax: [phone]   Patient ID: Carrie Pham, female    DOB: Aug 12, 1960, 63 y.o.   MRN: [id number]  This visit was conducted in person.  There were no vitals taken for this visit.   CC: CPE Subjective:   HPI: Carrie Pham is a 63 y.o. female presenting on 10/02/2023 for No chief complaint on file.  PATIENT MISSED APPOINTMENT   DM - stable period on ozempic  2mg  weekly started last year 2024, last visit we stopped metformin  as A1c dropped to 5.8%.   Recurrent syncope - s/p neurology evaluation with Lauraine Rocks PA at [facility].   14d Zio patch 08/2023 - NR 57-185, avg 85, 1 NSVT, 4 nonsustained SVT, rare ectopy, no sustained arrhythmias or afib.    Preventative: Colonoscopy 7-8 yrs ago - normal Claudio) - records requested  Mammogram 03/2022 Birads1 @ [facility] woman exam with OBGYN Dr Mat - seen 03/2022. Postmenopausal. Normal pap this year. S/p hysterectomy 1990s, ovaries remain DEXA scan - several years ago, normal - discussed rpt at age 44yo Lung cancer screening - not eligible Flu shot - yearly COVID vaccine Pfizer 04/2019 x2, booster 11/2019, bivalent 08/2020 Tetanus shot - unsure, declines today Pneumonia shot - not due Shingrix  - 04/2021, 05/2022 Advanced directive discussion - has this at home. Husband is HCPOA. Asked to bring us  a copy.  Seat belt use discussed Sunscreen use discussed. No changing moles on skin. 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 Interim medical history since our last visit reviewed. Allergies and medications reviewed and updated. Outpatient Medications Prior to  Visit  Medication Sig Dispense Refill   celecoxib  (CELEBREX ) 200 MG capsule Take 1 capsule (200 mg total) by mouth daily.     citalopram  (CELEXA ) 20 MG tablet Take 1 tablet (20 mg total) by mouth daily. 90 tablet 4   cyanocobalamin  (VITAMIN B12) 1000 MCG tablet Take 1 tablet (1,000 mcg total) by mouth daily.     fluconazole (DIFLUCAN) 100 MG tablet Take 100 mg by mouth daily as needed. As needed for yeast infection     levothyroxine  (SYNTHROID ) 125 MCG tablet TAKE 1 TABLET BY MOUTH ONCE DAILY IN THE MORNING BEFORE BREAKFAST 90 tablet 2   melatonin 5 MG TABS Take 1 tablet (5 mg total) by mouth at bedtime as needed.     metoprolol  tartrate (LOPRESSOR ) 50 MG tablet Take 1 tablet (50 mg total) by mouth 2 (two) times daily. 180 tablet 4   ondansetron  (ZOFRAN ) 4 MG tablet Take 1 tablet (4 mg total) by mouth every 8 (eight) hours as needed for nausea or vomiting. 20 tablet 0   Semaglutide , 2 MG/DOSE, 8 MG/3ML SOPN Inject 2 mg as directed once a week. 3 mL 6   thiamine  (VITAMIN B1) 100 MG tablet Take 1 tablet (100 mg total) by mouth once a week.     No facility-administered medications prior to visit.     Per HPI unless specifically indicated in ROS section below Review of Systems  Constitutional:  Negative for activity change, appetite change, chills, fatigue, fever and unexpected weight change.  HENT:  Negative  for hearing loss.   Eyes:  Negative for visual disturbance.  Respiratory:  Negative for cough, chest tightness, shortness of breath and wheezing.   Cardiovascular:  Negative for chest pain, palpitations and leg swelling.  Gastrointestinal:  Negative for abdominal distention, abdominal pain, blood in stool, constipation, diarrhea, nausea and vomiting.  Genitourinary:  Negative for difficulty urinating and hematuria.  Musculoskeletal:  Negative for arthralgias, myalgias and neck pain.  Skin:  Negative for rash.  Neurological:  Negative for dizziness, seizures, syncope and headaches.   Hematological:  Negative for adenopathy. Does not bruise/bleed easily.  Psychiatric/Behavioral:  Negative for dysphoric mood. The patient is not nervous/anxious.     Objective:  There were no vitals taken for this visit.  Wt Readings from Last 3 Encounters:  07/24/23 152 lb (68.9 kg)  07/18/23 153 lb 2 oz (69.5 kg)  07/11/23 148 lb (67.1 kg)      Physical Exam Vitals and nursing note reviewed.  Constitutional:      Appearance: Normal appearance. She is not ill-appearing.  HENT:     Head: Normocephalic and atraumatic.     Right Ear: Tympanic membrane, ear canal and external ear normal. There is no impacted cerumen.     Left Ear: Tympanic membrane, ear canal and external ear normal. There is no impacted cerumen.     Mouth/Throat:     Mouth: Mucous membranes are moist.     Pharynx: Oropharynx is clear. No oropharyngeal exudate or posterior oropharyngeal erythema.  Eyes:     General:        Right eye: No discharge.        Left eye: No discharge.     Extraocular Movements: Extraocular movements intact.     Conjunctiva/sclera: Conjunctivae normal.     Pupils: Pupils are equal, round, and reactive to light.  Neck:     Thyroid : No thyroid  mass or thyromegaly.  Cardiovascular:     Rate and Rhythm: Normal rate and regular rhythm.     Pulses: Normal pulses.     Heart sounds: Normal heart sounds. No murmur heard. Pulmonary:     Effort: Pulmonary effort is normal. No respiratory distress.     Breath sounds: Normal breath sounds. No wheezing, rhonchi or rales.  Abdominal:     General: Bowel sounds are normal. There is no distension.     Palpations: Abdomen is soft. There is no mass.     Tenderness: There is no abdominal tenderness. There is no guarding or rebound.     Hernia: No hernia is present.  Musculoskeletal:     Cervical back: Normal range of motion and neck supple. No rigidity.     Right lower leg: No edema.     Left lower leg: No edema.  Lymphadenopathy:     Cervical: No  cervical adenopathy.  Skin:    General: Skin is warm and dry.     Findings: No rash.  Neurological:     General: No focal deficit present.     Mental Status: She is alert. Mental status is at baseline.  Psychiatric:        Mood and Affect: Mood normal.        Behavior: Behavior normal.       Results for orders placed or performed in visit on 07/18/23  CBC with Differential/Platelet   Collection Time: 07/18/23  2:46 PM  Result Value Ref Range   WBC 7.6 4.0 - 10.5 K/uL   RBC 4.28 3.87 - 5.11 Mil/uL  Hemoglobin 12.3 12.0 - 15.0 g/dL   HCT 62.5 63.9 - 53.9 %   MCV 87.4 78.0 - 100.0 fl   MCHC 32.9 30.0 - 36.0 g/dL   RDW 86.8 88.4 - 84.4 %   Platelets 278.0 150.0 - 400.0 K/uL   Neutrophils Relative % 65.2 43.0 - 77.0 %   Lymphocytes Relative 26.7 12.0 - 46.0 %   Monocytes Relative 5.5 3.0 - 12.0 %   Eosinophils Relative 1.5 0.0 - 5.0 %   Basophils Relative 1.1 0.0 - 3.0 %   Neutro Abs 5.0 1.4 - 7.7 K/uL   Lymphs Abs 2.0 0.7 - 4.0 K/uL   Monocytes Absolute 0.4 0.1 - 1.0 K/uL   Eosinophils Absolute 0.1 0.0 - 0.7 K/uL   Basophils Absolute 0.1 0.0 - 0.1 K/uL  Basic metabolic panel with GFR   Collection Time: 07/18/23  2:46 PM  Result Value Ref Range   Sodium 139 135 - 145 mEq/L   Potassium 4.4 3.5 - 5.1 mEq/L   Chloride 105 96 - 112 mEq/L   CO2 26 19 - 32 mEq/L   Glucose, Bld 105 (H) 70 - 99 mg/dL   BUN 19 6 - 23 mg/dL   Creatinine, Ser 9.11 0.40 - 1.20 mg/dL   GFR 30.20 >39.99 mL/min   Calcium 10.0 8.4 - 10.5 mg/dL  Hemoglobin J8r   Collection Time: 07/18/23  2:46 PM  Result Value Ref Range   Hgb A1c MFr Bld 5.8 4.6 - 6.5 %  TSH   Collection Time: 07/18/23  2:46 PM  Result Value Ref Range   TSH 1.66 0.35 - 5.50 uIU/mL  Vitamin B12   Collection Time: 07/18/23  2:46 PM  Result Value Ref Range   Vitamin B-12 203 (L) 211 - 911 pg/mL  Vitamin B1   Collection Time: 07/18/23  2:46 PM  Result Value Ref Range   Vitamin B1 (Thiamine ) 8 8 - 30 nmol/L    Assessment &  Plan:   Problem List Items Addressed This Visit   None    No orders of the defined types were placed in this encounter.   No orders of the defined types were placed in this encounter.   There are no Patient Instructions on file for this visit.  Follow up plan: No follow-ups on file.  Anton Blas, MD

## 2023-10-16 NOTE — Progress Notes (Unsigned)
 Cardiology Office Note   Date:  10/17/2023  ID:  Carrie Pham, DOB 09/02/1960, MRN [id number] PCP: Carrie Baller, MD  [facility] Providers Cardiologist:  Caron Poser, MD     History of Present Illness Carrie Pham is a 63 y.o. female PMH DM2, PSVT who presents for further evaluation management of reported syncope.  Patient was seen in the ED June 2025 following an episode of syncope.  Serial troponin was normal.  Basic labs were notable only for a mild AKI.  Patient reports that she has had 2 episodes of syncope since last February.  Both episodes occurred around the restroom.  Both times she had the sensation of having to use the bathroom, then she felt lightheaded and nauseous, and then she had a syncopal event.  Recent monitor did not show any evidence of sustained arrhythmias.  She has not had any other episodes since June.  She reports a single episode about 20 years ago where she was at home and suddenly went into an extremely rapid heart rate in the 200s or so.  She says that she went to the local ED and adenosine was administered with termination.  She has not had any such symptoms with recurrent events.  Relevant CVD History - Monitor 08/21/2023 mean heart rate 85.  Single 4 beat episode NSVT.  4 brief salvos of SVT (longest 10 beats).  Rare PACs/PVCs.  No arrhythmia. -Mild CAC PDA from [facility] 02/2022   ROS: Pt denies any chest discomfort, jaw pain, arm pain, palpitations, syncope, presyncope, orthopnea, PND, or LE edema.  Studies Reviewed I have independently reviewed the patient's ECG, previous cardiac testing, previous medical records, recent blood work.  Physical Exam VS:  BP 106/70 (BP Location: Right Arm, Patient Position: Sitting, Cuff Size: Normal)   Pulse 77   Ht 5' 7 (1.702 m)   Wt 152 lb (68.9 kg)   SpO2 97%   BMI 23.81 kg/m   Orthostatic VS for the past 24 hrs (Last 3 readings):  BP- Lying Pulse- Lying BP- Sitting Pulse- Sitting BP- Standing  at 0 minutes Pulse- Standing at 0 minutes BP- Standing at 3 minutes Pulse- Standing at 3 minutes  10/17/23 1533 123/74 76 121/73 78 118/74 79 125/79 82      Wt Readings from Last 3 Encounters:  10/17/23 152 lb (68.9 kg)  07/24/23 152 lb (68.9 kg)  07/18/23 153 lb 2 oz (69.5 kg)    GEN: No acute distress. NECK: No JVD; No carotid bruits. CARDIAC: RRR, no murmurs, rubs, gallops. RESPIRATORY:  Clear to auscultation. EXTREMITIES:  Warm and well-perfused. No edema.  ASSESSMENT AND PLAN Syncope and collapse Patient's recent syncopal episodes are most consistent with a vasovagal etiology.  Both episodes occurred around the time of needing to use the restroom and had the expected prodrome.  Her recent monitor was overall reassuring and did not show any sustained or high-grade arrhythmias.  Orthostatic vital signs today were normal.  Plan: - Echocardiogram to ensure no structural causes - I recommended that she do conservative methods such as ensuring that she is sitting down when episode comes on, counterpressure maneuvers, ensuring adequate hydration, etc.  PSVT Patient reports a single isolated episode of sustained SVT approximately 20 years ago which was terminated with adenosine.  The symptoms of this episode are much different than what she has had here recently and they did not cause syncope.  She has not had recurrence and her monitor did not show anything aside from  very brief salvos of SVT.  Would just recommend continuing her metoprolol  at current dose for now.        Dispo: RTC as needed  Signed, Caron Poser, MD

## 2023-10-17 ENCOUNTER — Ambulatory Visit

## 2023-10-17 VITALS — BP 106/70 | HR 77 | Ht 67.0 in | Wt 152.0 lb

## 2023-10-17 DIAGNOSIS — R55 Syncope and collapse: Secondary | ICD-10-CM | POA: Diagnosis not present

## 2023-10-17 DIAGNOSIS — I2510 Atherosclerotic heart disease of native coronary artery without angina pectoris: Secondary | ICD-10-CM | POA: Diagnosis not present

## 2023-10-17 NOTE — Patient Instructions (Signed)
 Medication Instructions:  Your physician recommends that you continue on your current medications as directed. Please refer to the Current Medication list given to you today.  *If you need a refill on your cardiac medications before your next appointment, please call your pharmacy*  Lab Work: No labs ordered today  If you have labs (blood work) drawn today and your tests are completely normal, you will receive your results only by: MyChart Message (if you have MyChart) OR A paper copy in the mail If you have any lab test that is abnormal or we need to change your treatment, we will call you to review the results.  Testing/Procedures: Your physician has requested that you have an echocardiogram. Echocardiography is a painless test that uses sound waves to create images of your heart. It provides your doctor with information about the size and shape of your heart and how well your heart's chambers and valves are working.   You may receive an ultrasound enhancing agent through an IV if needed to better visualize your heart during the echo. This procedure takes approximately one hour.  There are no restrictions for this procedure.  This will take place at [address] [facility] [address], [place] [address]  Please note: We ask at that you not bring children with you during ultrasound (echo/ vascular) testing. Due to room size and safety concerns, children are not allowed in the ultrasound rooms during exams. Our front office staff cannot provide observation of children in our lobby area while testing is being conducted. An adult accompanying a patient to their appointment will only be allowed in the ultrasound room at the discretion of the ultrasound technician under special circumstances. We apologize for any inconvenience.   Follow-Up: At [facility], you and your health needs are our priority.  As part of our continuing mission to provide you with exceptional heart  care, our providers are all part of one team.  This team includes your primary Cardiologist (physician) and Advanced Practice Providers or APPs (Physician Assistants and Nurse Practitioners) who all work together to provide you with the care you need, when you need it.  Your next appointment:    AS NEEDED  Provider:   Caron Poser, MD   We recommend signing up for the patient portal called MyChart.  Sign up information is provided on this After Visit Summary.  MyChart is used to connect with patients for Virtual Visits (Telemedicine).  Patients are able to view lab/test results, encounter notes, upcoming appointments, etc.  Non-urgent messages can be sent to your provider as well.   To learn more about what you can do with MyChart, go to [email or web address].

## 2023-10-21 ENCOUNTER — Other Ambulatory Visit: Payer: Self-pay | Admitting: Family Medicine

## 2023-11-07 ENCOUNTER — Ambulatory Visit

## 2023-11-07 DIAGNOSIS — E538 Deficiency of other specified B group vitamins: Secondary | ICD-10-CM

## 2023-11-07 MED ORDER — CYANOCOBALAMIN 1000 MCG/ML IJ SOLN
1000.0000 ug | Freq: Once | INTRAMUSCULAR | Status: AC
  Administered 2023-11-07: 1000 ug via INTRAMUSCULAR

## 2023-11-07 NOTE — Progress Notes (Signed)
 Per orders of Dr. Eustaquio Boyden, injection of B-12 given by Melina Copa in left deltoid. Patient tolerated injection well. Patient will make appointment for 1 month.

## 2023-11-23 ENCOUNTER — Other Ambulatory Visit: Payer: Self-pay | Admitting: Family Medicine

## 2023-11-24 NOTE — Telephone Encounter (Signed)
 Last given 10/23/23 Has follow up 12/04/23

## 2023-11-29 ENCOUNTER — Other Ambulatory Visit

## 2023-12-04 ENCOUNTER — Ambulatory Visit (INDEPENDENT_AMBULATORY_CARE_PROVIDER_SITE_OTHER): Admitting: Family Medicine

## 2023-12-04 ENCOUNTER — Encounter: Payer: Self-pay | Admitting: Family Medicine

## 2023-12-04 VITALS — BP 118/64 | HR 82 | Temp 97.6°F | Ht 66.0 in | Wt 154.2 lb

## 2023-12-04 DIAGNOSIS — E519 Thiamine deficiency, unspecified: Secondary | ICD-10-CM | POA: Diagnosis not present

## 2023-12-04 DIAGNOSIS — E039 Hypothyroidism, unspecified: Secondary | ICD-10-CM | POA: Diagnosis not present

## 2023-12-04 DIAGNOSIS — I10 Essential (primary) hypertension: Secondary | ICD-10-CM | POA: Diagnosis not present

## 2023-12-04 DIAGNOSIS — E538 Deficiency of other specified B group vitamins: Secondary | ICD-10-CM

## 2023-12-04 DIAGNOSIS — F3342 Major depressive disorder, recurrent, in full remission: Secondary | ICD-10-CM

## 2023-12-04 DIAGNOSIS — G2581 Restless legs syndrome: Secondary | ICD-10-CM

## 2023-12-04 DIAGNOSIS — E1169 Type 2 diabetes mellitus with other specified complication: Secondary | ICD-10-CM | POA: Diagnosis not present

## 2023-12-04 DIAGNOSIS — Z7985 Long-term (current) use of injectable non-insulin antidiabetic drugs: Secondary | ICD-10-CM

## 2023-12-04 DIAGNOSIS — Z0000 Encounter for general adult medical examination without abnormal findings: Secondary | ICD-10-CM | POA: Diagnosis not present

## 2023-12-04 DIAGNOSIS — Z23 Encounter for immunization: Secondary | ICD-10-CM

## 2023-12-04 DIAGNOSIS — R55 Syncope and collapse: Secondary | ICD-10-CM

## 2023-12-04 MED ORDER — TIRZEPATIDE 5 MG/0.5ML ~~LOC~~ SOAJ: 5.0000 mg | SUBCUTANEOUS | 0 refills | Status: AC

## 2023-12-04 MED ORDER — LEVOTHYROXINE SODIUM 125 MCG PO TABS: ORAL_TABLET | ORAL | 3 refills | Status: AC

## 2023-12-04 MED ORDER — TIRZEPATIDE 2.5 MG/0.5ML ~~LOC~~ SOAJ: 2.5000 mg | SUBCUTANEOUS | 0 refills | Status: AC

## 2023-12-04 MED ORDER — CYANOCOBALAMIN 1000 MCG/ML IJ SOLN
1000.0000 ug | Freq: Once | INTRAMUSCULAR | Status: AC
  Administered 2023-12-04: 1000 ug via INTRAMUSCULAR

## 2023-12-04 MED ORDER — TIRZEPATIDE 7.5 MG/0.5ML ~~LOC~~ SOAJ: 7.5000 mg | SUBCUTANEOUS | 1 refills | Status: AC

## 2023-12-04 MED ORDER — CITALOPRAM HYDROBROMIDE 20 MG PO TABS: 20.0000 mg | ORAL_TABLET | Freq: Every day | ORAL | 3 refills | Status: AC

## 2023-12-04 MED ORDER — METOPROLOL TARTRATE 50 MG PO TABS: 50.0000 mg | ORAL_TABLET | Freq: Two times a day (BID) | ORAL | 3 refills | Status: AC

## 2023-12-04 NOTE — Assessment & Plan Note (Signed)
 Preventative protocols reviewed and updated unless pt declined. Discussed healthy diet and lifestyle.

## 2023-12-04 NOTE — Assessment & Plan Note (Signed)
Chronic, stable on metoprolol.  

## 2023-12-04 NOTE — Assessment & Plan Note (Addendum)
 Reassuring cardiac and neurological evaluation - thought vasovagal syncope.  No recurrent syncope. Will continue to monitor.

## 2023-12-04 NOTE — Patient Instructions (Addendum)
 Flu shot today  B12 shot today.  Labs today  Ok to try/price out Mounajro in place of ozempic . Start 2.5mg  weekly shot for 1 month then 5mg  for 1 month then 7.5mg . Schedule follow up in visit 3 months Call Dr Kristie to ask about colon cancer screening next steps.  We will request latest mammogram from Dr Elfredia office ([facility]) Good to see you today Return in 3 months for diabetes follow up visit

## 2023-12-04 NOTE — Assessment & Plan Note (Signed)
 Continue weekly thiamine  replacement, update B1 levels.

## 2023-12-04 NOTE — Assessment & Plan Note (Addendum)
 Levels traditionally low, we have started monthly shots as of 07/2023, and continue oral b12 replacement.  Update B12 levels today.  Update IF Ab levels next labwork.

## 2023-12-04 NOTE — Assessment & Plan Note (Signed)
 Recent TSH WNL - continue levothyroxine  125mcg daily

## 2023-12-04 NOTE — Assessment & Plan Note (Signed)
 Chronic, stable only on ozempic . She desires to transition to Mounjaro hopeful for more weight loss effect. Reviewed combination mounjaro molecule, possible increased side effects.  Will start titrating dose from 2.5 to 7.5mg  weekly over 3 months then RTC 37mo DM f/u visit

## 2023-12-04 NOTE — Progress Notes (Signed)
 Ph: [phone] Fax: [phone]   Patient ID: Carrie Pham, female    DOB: 17-Mar-1960, 63 y.o.   MRN: [id number]  This visit was conducted in person.  BP 118/64   Pulse 82   Temp 97.6 F (36.4 C) (Oral)   Ht 5' 6 (1.676 m)   Wt 154 lb 4 oz (70 kg)   SpO2 97%   BMI 24.90 kg/m   BP Readings from Last 3 Encounters:  12/04/23 118/64  10/17/23 106/70  07/24/23 118/68   CC: CPE Subjective:   HPI: Carrie Pham is a 63 y.o. female presenting on 12/04/2023 for Annual Exam   DM - stable period on ozempic  2mg  weekly started last year 2024, stopped metformin . occ constipation managed with miralax.  B12 shots monthly + oral replacement 1000mcg daily.  Also on b1 thiamine  100mg  once weekly.   Recurrent syncope - s/p neurology evaluation with Lauraine Rocks PA at [facility] clinic and Dr [facility] cardiology. EEKG was normal Thought vasovagal syncope. Echocardiogram was ordered, has not been completed yet. Orthostatics normal at cardiology office.    14d Zio patch 08/2023 - NR 57-185, avg 85, 1 NSVT, 4 nonsustained SVT, rare ectopy, no sustained arrhythmias or afib.   RLS - did not start oral iron or magnesium . Gabapentin  has helped significantly - 600mg  at night time.  ?Gluten intolerance.  Preventative: Colonoscopy 7-8 yrs ago - normal [facility]audio) - records requested. Had trouble tolerating prep.  Mammogram 03/2023 @ [facility] , per pt normal - upcoming appt 01/2024 Well woman exam with OBGYN Dr Mat - seen 03/2023. Postmenopausal. S/p hysterectomy 1990s for heavy bleeding, ovaries remain.  DEXA scan - several years ago, normal - discussed rpt at age 12yo Lung cancer screening - not eligible Flu shot - yearly COVID vaccine Pfizer 04/2019 x2, booster 11/2019, bivalent 08/2020, 01/2021  Tdap 07/2023 Prevnar-20 - next time  Shingrix  - 04/2021, 05/2022 Advanced directive discussion - has this at home. Husband is HCPOA. Asked to bring us  a copy.  Seat belt use  discussed Sunscreen use discussed. No changing moles on skin. Non smoker Alcohol - seldom Dentist - q6 mo  Eye exam - yearly - cataract surgery 2 yrs ago, none since Bowel - occ constipation managed with miralax Bladder - occ stress incontinence with sneezing   Lives with husband Occ: processes mortgage loans  Activity: joined gym going 3-4 /wk Diet: poor water , good fruits/vegetables      Relevant past medical, surgical, family and social history reviewed and updated as indicated. Interim medical history since our last visit reviewed. Allergies and medications reviewed and updated. Outpatient Medications Prior to Visit  Medication Sig Dispense Refill   gabapentin  (NEURONTIN ) 600 MG tablet Take 1 tablet (600 mg total) by mouth at bedtime.     celecoxib  (CELEBREX ) 200 MG capsule Take 1 capsule (200 mg total) by mouth daily.     cyanocobalamin  (VITAMIN B12) 1000 MCG tablet Take 1 tablet (1,000 mcg total) by mouth daily.     fluconazole (DIFLUCAN) 100 MG tablet Take 100 mg by mouth daily as needed. As needed for yeast infection     melatonin 5 MG TABS Take 1 tablet (5 mg total) by mouth at bedtime as needed.     ondansetron  (ZOFRAN ) 4 MG tablet Take 1 tablet (4 mg total) by mouth every 8 (eight) hours as needed for nausea or vomiting. 20 tablet 0   OZEMPIC , 2 MG/DOSE, 8 MG/3ML SOPN INJECT 2MG  AS DIRECTED ONCE  WEEKLY 3 mL 0   thiamine  (VITAMIN B1) 100 MG tablet Take 1 tablet (100 mg total) by mouth once a week.     citalopram  (CELEXA ) 20 MG tablet Take 1 tablet (20 mg total) by mouth daily. 90 tablet 4   gabapentin  (NEURONTIN ) 100 MG capsule Take 200 mg by mouth at bedtime.     levothyroxine  (SYNTHROID ) 125 MCG tablet TAKE 1 TABLET BY MOUTH ONCE DAILY IN THE MORNING BEFORE BREAKFAST 90 tablet 2   metoprolol  tartrate (LOPRESSOR ) 50 MG tablet Take 1 tablet (50 mg total) by mouth 2 (two) times daily. 180 tablet 4   No facility-administered medications prior to visit.     Per HPI unless  specifically indicated in ROS section below Review of Systems  Constitutional:  Negative for activity change, appetite change, chills, fatigue, fever and unexpected weight change.  HENT:  Negative for hearing loss.   Eyes:  Negative for visual disturbance.  Respiratory:  Negative for cough, chest tightness, shortness of breath and wheezing.   Cardiovascular:  Negative for chest pain, palpitations and leg swelling.  Gastrointestinal:  Negative for abdominal distention, abdominal pain, blood in stool, constipation, diarrhea, nausea and vomiting.  Genitourinary:  Negative for difficulty urinating and hematuria.  Musculoskeletal:  Negative for arthralgias, myalgias and neck pain.  Skin:  Negative for rash.  Neurological:  Positive for dizziness (occ). Negative for seizures, syncope and headaches.  Hematological:  Negative for adenopathy. Does not bruise/bleed easily.  Psychiatric/Behavioral:  Negative for dysphoric mood. The patient is not nervous/anxious.     Objective:  BP 118/64   Pulse 82   Temp 97.6 F (36.4 C) (Oral)   Ht 5' 6 (1.676 m)   Wt 154 lb 4 oz (70 kg)   SpO2 97%   BMI 24.90 kg/m   Wt Readings from Last 3 Encounters:  12/04/23 154 lb 4 oz (70 kg)  10/17/23 152 lb (68.9 kg)  07/24/23 152 lb (68.9 kg)      Physical Exam Vitals and nursing note reviewed.  Constitutional:      Appearance: Normal appearance. She is not ill-appearing.  HENT:     Head: Normocephalic and atraumatic.     Right Ear: Tympanic membrane, ear canal and external ear normal. There is no impacted cerumen.     Left Ear: Tympanic membrane, ear canal and external ear normal. There is no impacted cerumen.     Mouth/Throat:     Mouth: Mucous membranes are moist.     Pharynx: Oropharynx is clear. No oropharyngeal exudate or posterior oropharyngeal erythema.  Eyes:     General:        Right eye: No discharge.        Left eye: No discharge.     Extraocular Movements: Extraocular movements intact.      Conjunctiva/sclera: Conjunctivae normal.     Pupils: Pupils are equal, round, and reactive to light.  Neck:     Thyroid : No thyroid  mass or thyromegaly.     Vascular: No carotid bruit.  Cardiovascular:     Rate and Rhythm: Normal rate and regular rhythm.     Pulses: Normal pulses.     Heart sounds: Normal heart sounds. No murmur heard. Pulmonary:     Effort: Pulmonary effort is normal. No respiratory distress.     Breath sounds: Normal breath sounds. No wheezing, rhonchi or rales.  Abdominal:     General: Bowel sounds are normal. There is no distension.     Palpations: Abdomen is  soft. There is no mass.     Tenderness: There is no abdominal tenderness. There is no guarding or rebound.     Hernia: No hernia is present.  Musculoskeletal:     Cervical back: Normal range of motion and neck supple. No rigidity.     Right lower leg: No edema.     Left lower leg: No edema.  Lymphadenopathy:     Cervical: No cervical adenopathy.  Skin:    General: Skin is warm and dry.     Findings: No rash.  Neurological:     General: No focal deficit present.     Mental Status: She is alert. Mental status is at baseline.  Psychiatric:        Mood and Affect: Mood normal.        Behavior: Behavior normal.       Results for orders placed or performed in visit on 07/18/23  CBC with Differential/Platelet   Collection Time: 07/18/23  2:46 PM  Result Value Ref Range   WBC 7.6 4.0 - 10.5 K/uL   RBC 4.28 3.87 - 5.11 Mil/uL   Hemoglobin 12.3 12.0 - 15.0 g/dL   HCT 62.5 63.9 - 53.9 %   MCV 87.4 78.0 - 100.0 fl   MCHC 32.9 30.0 - 36.0 g/dL   RDW 86.8 88.4 - 84.4 %   Platelets 278.0 150.0 - 400.0 K/uL   Neutrophils Relative % 65.2 43.0 - 77.0 %   Lymphocytes Relative 26.7 12.0 - 46.0 %   Monocytes Relative 5.5 3.0 - 12.0 %   Eosinophils Relative 1.5 0.0 - 5.0 %   Basophils Relative 1.1 0.0 - 3.0 %   Neutro Abs 5.0 1.4 - 7.7 K/uL   Lymphs Abs 2.0 0.7 - 4.0 K/uL   Monocytes Absolute 0.4 0.1 - 1.0  K/uL   Eosinophils Absolute 0.1 0.0 - 0.7 K/uL   Basophils Absolute 0.1 0.0 - 0.1 K/uL  Basic metabolic panel with GFR   Collection Time: 07/18/23  2:46 PM  Result Value Ref Range   Sodium 139 135 - 145 mEq/L   Potassium 4.4 3.5 - 5.1 mEq/L   Chloride 105 96 - 112 mEq/L   CO2 26 19 - 32 mEq/L   Glucose, Bld 105 (H) 70 - 99 mg/dL   BUN 19 6 - 23 mg/dL   Creatinine, Ser 9.11 0.40 - 1.20 mg/dL   GFR 30.20 >39.99 mL/min   Calcium 10.0 8.4 - 10.5 mg/dL  Hemoglobin J8r   Collection Time: 07/18/23  2:46 PM  Result Value Ref Range   Hgb A1c MFr Bld 5.8 4.6 - 6.5 %  TSH   Collection Time: 07/18/23  2:46 PM  Result Value Ref Range   TSH 1.66 0.35 - 5.50 uIU/mL  Vitamin B12   Collection Time: 07/18/23  2:46 PM  Result Value Ref Range   Vitamin B-12 203 (L) 211 - 911 pg/mL  Vitamin B1   Collection Time: 07/18/23  2:46 PM  Result Value Ref Range   Vitamin B1 (Thiamine ) 8 8 - 30 nmol/L   Lab Results  Component Value Date   IRON 87 05/17/2022   TIBC 435.4 05/17/2022   FERRITIN 22.9 05/17/2022   Assessment & Plan:   Problem List Items Addressed This Visit     Health maintenance examination - Primary (Chronic)   Preventative protocols reviewed and updated unless pt declined. Discussed healthy diet and lifestyle.       Type 2 diabetes mellitus with other specified complication (  HCC)   Chronic, stable only on ozempic . She desires to transition to Mounjaro hopeful for more weight loss effect. Reviewed combination mounjaro molecule, possible increased side effects.  Will start titrating dose from 2.5 to 7.5mg  weekly over 3 months then RTC 55mo DM f/u visit       Relevant Medications   tirzepatide [facility]) 2.5 MG/0.5ML Pen   tirzepatide CLOYDE) 5 MG/0.5ML Pen (Start on 01/01/2024)   tirzepatide [facility]) 7.5 MG/0.5ML Pen (Start on 01/29/2024)   Other Relevant Orders   Lipid panel   Comprehensive metabolic panel with GFR   RLS (restless legs syndrome)   Diagnosed by  neurology - started on gabapentin  with benefit.  Has not yet started oral iron replacement      Hypertension   Chronic, stable on metoprolol .      Relevant Medications   metoprolol  tartrate (LOPRESSOR ) 50 MG tablet   Hypothyroidism   Recent TSH WNL - continue levothyroxine  125mcg daily      Relevant Medications   levothyroxine  (SYNTHROID ) 125 MCG tablet   metoprolol  tartrate (LOPRESSOR ) 50 MG tablet   MDD (major depressive disorder)   Chronic, stable period on celexa  - continue.       Relevant Medications   citalopram  (CELEXA ) 20 MG tablet   Syncope   Reassuring cardiac and neurological evaluation - thought vasovagal syncope.  No recurrent syncope. Will continue to monitor.       Vitamin B12 deficiency   Levels traditionally low, we have started monthly shots as of 07/2023, and continue oral b12 replacement.  Update B12 levels today.  Update IF Ab levels next labwork.      Relevant Orders   Vitamin B12   Thiamine  deficiency   Continue weekly thiamine  replacement, update B1 levels.       Relevant Orders   Vitamin B1   Other Visit Diagnoses       Encounter for immunization       Relevant Orders   Flu vaccine trivalent PF, 6mos and older(Flulaval,Afluria,Fluarix,Fluzone) (Completed)        Meds ordered this encounter  Medications   tirzepatide (MOUNJARO) 2.5 MG/0.5ML Pen    Sig: Inject 2.5 mg into the skin once a week.    Dispense:  2 mL    Refill:  0    For the first month   tirzepatide (MOUNJARO) 5 MG/0.5ML Pen    Sig: Inject 5 mg into the skin once a week.    Dispense:  2 mL    Refill:  0   tirzepatide (MOUNJARO) 7.5 MG/0.5ML Pen    Sig: Inject 7.5 mg into the skin once a week.    Dispense:  2 mL    Refill:  1   citalopram  (CELEXA ) 20 MG tablet    Sig: Take 1 tablet (20 mg total) by mouth daily.    Dispense:  90 tablet    Refill:  3   levothyroxine  (SYNTHROID ) 125 MCG tablet    Sig: TAKE 1 TABLET BY MOUTH ONCE DAILY IN THE MORNING BEFORE  BREAKFAST    Dispense:  90 tablet    Refill:  3   metoprolol  tartrate (LOPRESSOR ) 50 MG tablet    Sig: Take 1 tablet (50 mg total) by mouth 2 (two) times daily.    Dispense:  180 tablet    Refill:  3   cyanocobalamin  (VITAMIN B12) injection 1,000 mcg    Orders Placed This Encounter  Procedures   Flu vaccine trivalent PF, 6mos and older(Flulaval,Afluria,Fluarix,Fluzone)   Vitamin  B12   Vitamin B1   Lipid panel   Comprehensive metabolic panel with GFR    Patient Instructions  Flu shot today  B12 shot today.  Labs today  Ok to try/price out Mounajro in place of ozempic . Start 2.5mg  weekly shot for 1 month then 5mg  for 1 month then 7.5mg . Schedule follow up in visit 3 months Call Dr Kristie to ask about colon cancer screening next steps.  We will request latest mammogram from Dr Elfredia office ([facility]) Good to see you today Return in 3 months for diabetes follow up visit   Follow up plan: Return in about 3 months (around 03/05/2024) for follow up visit.  Anton Blas, MD

## 2023-12-04 NOTE — Assessment & Plan Note (Signed)
 Diagnosed by neurology - started on gabapentin  with benefit.  Has not yet started oral iron replacement

## 2023-12-04 NOTE — Assessment & Plan Note (Signed)
Chronic, stable period on celexa - continue.  

## 2023-12-05 ENCOUNTER — Ambulatory Visit: Payer: Self-pay | Admitting: Family Medicine

## 2023-12-05 ENCOUNTER — Ambulatory Visit

## 2023-12-05 LAB — LIPID PANEL
Cholesterol: 166 mg/dL (ref 0–200)
HDL: 34.7 mg/dL — ABNORMAL LOW (ref >39.00)
LDL Cholesterol: 75 mg/dL (ref 0–99)
NonHDL: 130.83
Total CHOL/HDL Ratio: 5
Triglycerides: 281 mg/dL — ABNORMAL HIGH (ref 0.0–149.0)
VLDL: 56.2 mg/dL — ABNORMAL HIGH (ref 0.0–40.0)

## 2023-12-05 LAB — COMPREHENSIVE METABOLIC PANEL WITH GFR
ALT: 8 U/L (ref 0–35)
AST: 12 U/L (ref 0–37)
Albumin: 4.2 g/dL (ref 3.5–5.2)
Alkaline Phosphatase: 100 U/L (ref 39–117)
BUN: 25 mg/dL — ABNORMAL HIGH (ref 6–23)
CO2: 26 meq/L (ref 19–32)
Calcium: 10.3 mg/dL (ref 8.4–10.5)
Chloride: 103 meq/L (ref 96–112)
Creatinine, Ser: 0.67 mg/dL (ref 0.40–1.20)
GFR: 92.58 mL/min (ref >60.00)
Glucose, Bld: 62 mg/dL — ABNORMAL LOW (ref 70–99)
Potassium: 4.8 meq/L (ref 3.5–5.1)
Sodium: 136 meq/L (ref 135–145)
Total Bilirubin: 0.2 mg/dL (ref 0.2–1.2)
Total Protein: 7.7 g/dL (ref 6.0–8.3)

## 2023-12-05 LAB — VITAMIN B12: Vitamin B-12: 320 pg/mL (ref 211–911)

## 2023-12-07 LAB — VITAMIN B1: Vitamin B1 (Thiamine): 9 nmol/L (ref 8–30)

## 2023-12-11 ENCOUNTER — Ambulatory Visit: Payer: Self-pay | Admitting: Family Medicine

## 2024-01-02 ENCOUNTER — Ambulatory Visit

## 2024-01-02 DIAGNOSIS — E538 Deficiency of other specified B group vitamins: Secondary | ICD-10-CM

## 2024-01-02 MED ORDER — CYANOCOBALAMIN 1000 MCG/ML IJ SOLN
1000.0000 ug | Freq: Once | INTRAMUSCULAR | Status: AC
  Administered 2024-01-02: 16:00:00 1000 ug via INTRAMUSCULAR

## 2024-01-02 NOTE — Progress Notes (Signed)
 Per orders of Dr. Anton Blas, injection of B-12 given by Nellie Hummer in Right deltoid Patient tolerated injection well. Patient will make appointment for 1 month(s)

## 2024-01-05 NOTE — Telephone Encounter (Signed)
 Mounjaro  should be available at pharmacy through next month.

## 2024-02-06 ENCOUNTER — Ambulatory Visit

## 2024-02-06 DIAGNOSIS — E538 Deficiency of other specified B group vitamins: Secondary | ICD-10-CM

## 2024-02-06 MED ORDER — CYANOCOBALAMIN 1000 MCG/ML IJ SOLN
1000.0000 ug | Freq: Once | INTRAMUSCULAR | Status: AC
  Administered 2024-02-06: 1000 ug via INTRAMUSCULAR

## 2024-02-06 NOTE — Progress Notes (Signed)
 Per orders of Dr. Anton Blas, injection of B-12 given by Nellie Hummer in Left deltoid Patient tolerated injection well. Patient will make appointment for 1 month(s)

## 2024-02-15 ENCOUNTER — Other Ambulatory Visit: Payer: Self-pay | Admitting: Family Medicine

## 2024-02-15 DIAGNOSIS — E118 Type 2 diabetes mellitus with unspecified complications: Secondary | ICD-10-CM

## 2024-02-16 NOTE — Telephone Encounter (Signed)
 Per 07/20/23 Results F/u notes, rx stopped by Dr KANDICE.   Declined request.

## 2024-03-05 ENCOUNTER — Ambulatory Visit

## 2024-03-05 ENCOUNTER — Ambulatory Visit: Admitting: Family Medicine
